# Patient Record
Sex: Female | Born: 1965 | Race: White | Hispanic: No | Marital: Single | Smoking: Never smoker
Health system: Southern US, Community
[De-identification: ages and names within clinical notes are randomized; demographics above are authoritative.]

## PROBLEM LIST (undated history)

## (undated) ENCOUNTER — Emergency Department (HOSPITAL_COMMUNITY): Payer: BLUE CROSS/BLUE SHIELD

## (undated) DIAGNOSIS — I8390 Asymptomatic varicose veins of unspecified lower extremity: Secondary | ICD-10-CM

## (undated) HISTORY — PX: TONSILLECTOMY: SHX5217

## (undated) HISTORY — DX: Asymptomatic varicose veins of unspecified lower extremity: I83.90

## (undated) HISTORY — PX: TONSILLECTOMY: SUR1361

---

## 1998-12-14 ENCOUNTER — Inpatient Hospital Stay (HOSPITAL_COMMUNITY): Admission: AD | Admit: 1998-12-14 | Discharge: 1998-12-16 | Payer: Self-pay | Admitting: Family Medicine

## 1998-12-21 ENCOUNTER — Encounter: Admission: RE | Admit: 1998-12-21 | Discharge: 1998-12-21 | Payer: Self-pay | Admitting: Family Medicine

## 1999-03-21 ENCOUNTER — Ambulatory Visit (HOSPITAL_BASED_OUTPATIENT_CLINIC_OR_DEPARTMENT_OTHER): Admission: RE | Admit: 1999-03-21 | Discharge: 1999-03-21 | Payer: Self-pay | Admitting: Otolaryngology

## 1999-04-10 ENCOUNTER — Other Ambulatory Visit: Admission: RE | Admit: 1999-04-10 | Discharge: 1999-04-10 | Payer: Self-pay | Admitting: Obstetrics & Gynecology

## 2000-04-04 ENCOUNTER — Other Ambulatory Visit: Admission: RE | Admit: 2000-04-04 | Discharge: 2000-04-04 | Payer: Self-pay | Admitting: Obstetrics & Gynecology

## 2001-05-01 ENCOUNTER — Other Ambulatory Visit: Admission: RE | Admit: 2001-05-01 | Discharge: 2001-05-01 | Payer: Self-pay | Admitting: Obstetrics & Gynecology

## 2004-10-25 ENCOUNTER — Other Ambulatory Visit: Admission: RE | Admit: 2004-10-25 | Discharge: 2004-10-25 | Payer: Self-pay | Admitting: Family Medicine

## 2004-10-31 ENCOUNTER — Encounter: Admission: RE | Admit: 2004-10-31 | Discharge: 2004-10-31 | Payer: Self-pay | Admitting: Family Medicine

## 2006-09-06 ENCOUNTER — Other Ambulatory Visit: Admission: RE | Admit: 2006-09-06 | Discharge: 2006-09-06 | Payer: Self-pay | Admitting: Family Medicine

## 2009-05-24 ENCOUNTER — Encounter: Admission: RE | Admit: 2009-05-24 | Discharge: 2009-05-24 | Payer: Self-pay | Admitting: Family Medicine

## 2012-06-04 ENCOUNTER — Other Ambulatory Visit: Payer: Self-pay | Admitting: Family Medicine

## 2012-06-04 DIAGNOSIS — Z1231 Encounter for screening mammogram for malignant neoplasm of breast: Secondary | ICD-10-CM

## 2012-06-19 ENCOUNTER — Ambulatory Visit: Payer: Self-pay

## 2012-06-20 ENCOUNTER — Ambulatory Visit: Payer: Self-pay

## 2012-07-07 ENCOUNTER — Ambulatory Visit: Payer: Self-pay

## 2012-07-18 ENCOUNTER — Ambulatory Visit
Admission: RE | Admit: 2012-07-18 | Discharge: 2012-07-18 | Disposition: A | Discharge status: Home / Self Care | Payer: 59 | Source: Ambulatory Visit | Attending: Family Medicine | Admitting: Family Medicine

## 2012-07-18 DIAGNOSIS — Z1231 Encounter for screening mammogram for malignant neoplasm of breast: Secondary | ICD-10-CM

## 2012-07-23 ENCOUNTER — Other Ambulatory Visit: Payer: Self-pay | Admitting: Family Medicine

## 2012-07-23 DIAGNOSIS — R928 Other abnormal and inconclusive findings on diagnostic imaging of breast: Secondary | ICD-10-CM

## 2012-07-31 ENCOUNTER — Ambulatory Visit
Admission: RE | Admit: 2012-07-31 | Discharge: 2012-07-31 | Disposition: A | Discharge status: Home / Self Care | Payer: 59 | Source: Ambulatory Visit | Attending: Family Medicine | Admitting: Family Medicine

## 2012-07-31 DIAGNOSIS — R928 Other abnormal and inconclusive findings on diagnostic imaging of breast: Secondary | ICD-10-CM

## 2013-02-21 ENCOUNTER — Ambulatory Visit (INDEPENDENT_AMBULATORY_CARE_PROVIDER_SITE_OTHER): Payer: 59 | Admitting: Family Medicine

## 2013-02-21 VITALS — BP 146/89 | HR 98 | Temp 98.4°F | Resp 16 | Ht 65.0 in | Wt 261.0 lb

## 2013-02-21 DIAGNOSIS — IMO0002 Reserved for concepts with insufficient information to code with codable children: Secondary | ICD-10-CM

## 2013-02-21 DIAGNOSIS — M549 Dorsalgia, unspecified: Secondary | ICD-10-CM

## 2013-02-21 DIAGNOSIS — S39012A Strain of muscle, fascia and tendon of lower back, initial encounter: Secondary | ICD-10-CM

## 2013-02-21 MED ORDER — HYDROCODONE-ACETAMINOPHEN 5-325 MG PO TABS: 1.0000 | ORAL_TABLET | Freq: Three times a day (TID) | ORAL | Status: DC | PRN

## 2013-02-21 NOTE — Progress Notes (Signed)
[facility] [address] [place] [address] [phone]  Date:  02/21/2013   Name:  Pam Mclaughlin   DOB:  1966-03-13   MRN:  [id number]  PCP:  Ailene Ravel, MD    Chief Complaint: Back Pain   History of Present Illness:  Pam Mclaughlin is a 47 y.o. very pleasant female patient who presents with the following:  She is here today with a back spasm- it started yesterday afternoon.  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   She has tried some ibuprofen- she took 800 mg total.  Did not help at all.   She had some generic flexeril which she did try but it was expired.  She did not think it helped all that much in the past anyway.    She is perimenopausal LMP 5 or 6 months ago. She is SA with her husband only so there is no chance of pregnancy- he has had a vasectomy 16 years ago which was successful.   The pain does radiate down her right leg when she sits, but there is no numbness, weakness, or bowel/ bladder incontinence.   Sh has had some back strains in the past but nothing serious.    There are no active problems to display for this patient.   History reviewed. No pertinent past medical history.  Past Surgical History  Procedure Laterality Date  . Cesarean section      History  Substance Use Topics  . Smoking status: Never Smoker   . Smokeless tobacco: Not on file  . Alcohol Use: 12.6 oz/week    21 Cans of beer per week    History reviewed. No pertinent family history.  No Known Allergies  Medication list has been reviewed and updated.  No current outpatient prescriptions on file prior to visit.   No current facility-administered medications on file prior to visit.    Review of Systems:  As per HPI- otherwise negative.   Physical Examination: Filed Vitals:   02/21/13 1043  BP: 146/89  Pulse: 98  Temp: 98.4 F (36.9 C)  Resp: 16   Filed Vitals:   02/21/13 1043  Height: 5\' 5"  (1.651 m)  Weight: 261 lb (118.389 kg)   Body mass index is 43.43 kg/(m^2). Ideal Body Weight: Weight in (lb) to have BMI = 25: 149.9  GEN: WDWN, NAD, Non-toxic, A & O x 3, obese HEENT: Atraumatic, Normocephalic. Neck supple. No masses, No LAD. Ears and Nose: No external deformity. CV: RRR, No M/G/R. No JVD. No thrill. No extra heart sounds. PULM: CTA B, no wheezes, crackles, rhonchi. No retractions. No resp. distress. No accessory muscle use. EXTR: No c/c/e NEURO Normal gait.  PSYCH: Normally interactive. Conversant. Not depressed or anxious appearing.  Calm demeanor.  Back: she indicates tenderness in the right lower back muscles.  No bony tenderness.  Flexion is decreased due to pain, but normal extension and rotation.   She has some pull in her back with SLR on the right.  Normal strength, sensation and DTR both legs.     Assessment and Plan: Back pain - Plan: HYDROcodone-acetaminophen (NORCO/VICODIN) 5-325 MG per tablet  Back strain, initial encounter  Back strain- so far flexeril has not been helpful.  She is also taking ibuprofen without relief.  Will give a few vicodin to use as needed for back pain.  Recommended heat and /or ice as needed, gentle activity.  She declined x-rays today.  She will let me know if not better in the next few days.  Cautioned her regarding sedation with the hydrocodone/ do not use with alcohol   Signed Abbe Amsterdam, MD

## 2013-02-21 NOTE — Patient Instructions (Addendum)
You may continue to use ibuprofen or aleve as needed.  Use the hydrocodone for pain that does not respond to OTC medications- remember it can make you sleepy.  Try heat on the painful area.  Let me know if you are not better in the next few days- Sooner if worse.   Avoid alcohol with the hydrocodone pills.

## 2013-02-24 ENCOUNTER — Telehealth: Payer: Self-pay

## 2013-02-24 NOTE — Telephone Encounter (Signed)
Pt states that she is continuing to have  muscle spasms, pt would like to see if she could possibly get flexeril.  Best# [phone]   [facility] [address]

## 2013-02-24 NOTE — Telephone Encounter (Signed)
Patient calling again about getting muscle relaxer. States the medicine relaxes her more than her muscles. [phone]

## 2013-02-25 MED ORDER — CYCLOBENZAPRINE HCL 5 MG PO TABS: 5.0000 mg | ORAL_TABLET | Freq: Three times a day (TID) | ORAL | Status: DC | PRN

## 2013-02-25 NOTE — Telephone Encounter (Signed)
Done

## 2013-03-24 ENCOUNTER — Ambulatory Visit: Payer: 59

## 2013-03-24 ENCOUNTER — Ambulatory Visit (INDEPENDENT_AMBULATORY_CARE_PROVIDER_SITE_OTHER): Payer: 59 | Admitting: Family Medicine

## 2013-03-24 VITALS — BP 142/82 | HR 81 | Temp 98.2°F | Resp 18 | Ht 63.5 in | Wt 260.0 lb

## 2013-03-24 DIAGNOSIS — M25561 Pain in right knee: Secondary | ICD-10-CM

## 2013-03-24 DIAGNOSIS — E669 Obesity, unspecified: Secondary | ICD-10-CM

## 2013-03-24 DIAGNOSIS — M25569 Pain in unspecified knee: Secondary | ICD-10-CM

## 2013-03-24 MED ORDER — METHYLPREDNISOLONE ACETATE 80 MG/ML IJ SUSP
80.0000 mg | Freq: Once | INTRAMUSCULAR | Status: AC
  Administered 2013-03-24: 80 mg via INTRA_ARTICULAR

## 2013-03-24 NOTE — Progress Notes (Signed)
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 She has no history of trauma, no history of arthritis or other joint problems.  Objective: No acute distress Patient is overweight. Inspection of leg reveals multiple dilated veins including their cost disease and spider veins. Palpation of the medial joint line of the right knee reveals tenderness but no effusion. Range of motion: Normal with no crepitus [facility] reading (PRIMARY) by  Dr. Milus Glazier:  Right knee:  Negative  After explaining the pros and cons of knee injection, patient agreed to have the cortisone injection rather than taking pills by mouth. The medial right knee was prepped with Betadine and then anesthetized with ethyl chloride. 1 cc of Marcaine and 1 cc of Depo-Medrol 80 mg per mL were then instilled into the knee without complication. Band-Aid was applied and patient tolerated the procedure well.  Assessment: Inflammatory knee reaction to chronic obesity and job requirements  Plan: Patient to let me know if this does not solve the problem is for 6 months. I've talked to her at length about weight loss and diet.  Pain, knee, right - Plan: DG Knee 1-2 Views Right, methylPREDNISolone acetate (DEPO-MEDROL) injection 80 mg  Elvina Sidle, MD

## 2013-03-30 ENCOUNTER — Telehealth: Payer: Self-pay

## 2013-03-30 NOTE — Telephone Encounter (Signed)
Request given to xray 

## 2013-03-30 NOTE — Telephone Encounter (Signed)
Patient would like to pick up the xrays done of her knee to take to an appt tomorrow please call [phone] when ready

## 2014-10-27 ENCOUNTER — Other Ambulatory Visit: Payer: Self-pay | Admitting: Family Medicine

## 2014-10-27 DIAGNOSIS — R921 Mammographic calcification found on diagnostic imaging of breast: Secondary | ICD-10-CM

## 2014-10-28 ENCOUNTER — Encounter: Payer: Self-pay | Admitting: Vascular Surgery

## 2014-10-28 ENCOUNTER — Other Ambulatory Visit: Payer: Self-pay

## 2014-10-28 DIAGNOSIS — I83813 Varicose veins of bilateral lower extremities with pain: Secondary | ICD-10-CM

## 2014-11-03 ENCOUNTER — Other Ambulatory Visit: Payer: Self-pay | Admitting: Family Medicine

## 2014-11-03 ENCOUNTER — Ambulatory Visit
Admission: RE | Admit: 2014-11-03 | Discharge: 2014-11-03 | Disposition: A | Discharge status: Home / Self Care | Payer: 59 | Source: Ambulatory Visit | Attending: Family Medicine | Admitting: Family Medicine

## 2014-11-03 ENCOUNTER — Other Ambulatory Visit: Payer: Self-pay

## 2014-11-03 DIAGNOSIS — R921 Mammographic calcification found on diagnostic imaging of breast: Secondary | ICD-10-CM

## 2014-11-12 ENCOUNTER — Other Ambulatory Visit: Payer: Self-pay | Admitting: Family Medicine

## 2014-11-12 DIAGNOSIS — R921 Mammographic calcification found on diagnostic imaging of breast: Secondary | ICD-10-CM

## 2014-11-16 ENCOUNTER — Ambulatory Visit
Admission: RE | Admit: 2014-11-16 | Discharge: 2014-11-16 | Disposition: A | Discharge status: Home / Self Care | Payer: 59 | Source: Ambulatory Visit | Attending: Family Medicine | Admitting: Family Medicine

## 2014-11-16 DIAGNOSIS — R921 Mammographic calcification found on diagnostic imaging of breast: Secondary | ICD-10-CM

## 2014-12-08 ENCOUNTER — Encounter: Payer: Self-pay | Admitting: Vascular Surgery

## 2014-12-09 ENCOUNTER — Ambulatory Visit (INDEPENDENT_AMBULATORY_CARE_PROVIDER_SITE_OTHER): Payer: 59 | Admitting: Vascular Surgery

## 2014-12-09 ENCOUNTER — Encounter: Payer: Self-pay | Admitting: Vascular Surgery

## 2014-12-09 ENCOUNTER — Ambulatory Visit (HOSPITAL_COMMUNITY)
Admission: RE | Admit: 2014-12-09 | Discharge: 2014-12-09 | Disposition: A | Discharge status: Home / Self Care | Payer: 59 | Source: Ambulatory Visit | Attending: Vascular Surgery | Admitting: Vascular Surgery

## 2014-12-09 VITALS — BP 130/79 | HR 92 | Resp 16 | Ht 63.0 in | Wt 228.0 lb

## 2014-12-09 DIAGNOSIS — I83893 Varicose veins of bilateral lower extremities with other complications: Secondary | ICD-10-CM | POA: Insufficient documentation

## 2014-12-09 DIAGNOSIS — I83813 Varicose veins of bilateral lower extremities with pain: Secondary | ICD-10-CM

## 2014-12-09 NOTE — Progress Notes (Signed)
[facility] [place] HISTORY AND PHYSICAL   History of Present Illness:  Patient is a 49 y.o. year old female who presents for evaluation of symptomatic varicose veins.  The patient complains of heaviness and achiness and fullness in both lower extremities. This occurs after standing on her feet all day at work. Her symptoms improved somewhat with elevation and rest overnight. She has worn thigh high compression stockings 20-30 mmHg as well as knee-high compression therapy stockings that she got from elastic therapy. She has been wearing these for a year. She denies prior history of DVT. Her family history is unknown due to the fact that she is adopted. She has one varicosity across her right inner thigh which develops numbness tingling and irritation sometimes. She also has difficulty at work. 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 She denies any history of diabetes. She has lost 50 pounds in the last year trying to improve her symptoms.  Past Medical History  Diagnosis Date  . Varicose veins     Past Surgical History  Procedure Laterality Date  . Cesarean section    . Tonsillectomy      Social History History  Substance Use Topics  . Smoking status: Never Smoker   . Smokeless tobacco: Never Used  . Alcohol Use: No    Family History Family History  Problem Relation Age of Onset  . Adopted: Yes    Allergies  Allergies  Allergen Reactions  . Bee Venom      Current Outpatient Prescriptions  Medication Sig Dispense Refill  . ibuprofen (ADVIL,MOTRIN) 800 MG tablet Take 800 mg by mouth every 8 (eight) hours as needed for pain.    . cyclobenzaprine (FLEXERIL) 5 MG tablet Take 1 tablet (5 mg total) by mouth 3 (three) times daily as needed for muscle spasms. (Patient not taking: Reported on 12/09/2014) 30 tablet 1  . HYDROcodone-acetaminophen (NORCO/VICODIN) 5-325 MG per tablet Take 1  tablet by mouth every 8 (eight) hours as needed for pain. (Patient not taking: Reported on 12/09/2014) 20 tablet 0   No current facility-administered medications for this visit.    ROS:   General:  + weight loss, Fever, chills  HEENT: No recent headaches, no nasal bleeding, no visual changes, no sore throat  Neurologic: No dizziness, blackouts, seizures. No recent symptoms of stroke or mini- stroke. No recent episodes of slurred speech, or temporary blindness.  Cardiac: No recent episodes of chest pain/pressure, no shortness of breath at rest.  + shortness of breath with exertion.  Denies history of atrial fibrillation or irregular heartbeat  Vascular: No history of rest pain in feet.  No history of claudication.  No history of non-healing ulcer, No history of DVT   Pulmonary: No home oxygen, no productive cough, no hemoptysis,  No asthma or wheezing  Musculoskeletal:   Arthritis,  Low back pain,   Joint pain  Hematologic:No history of hypercoagulable state.  No history of easy bleeding.  No history of anemia  Gastrointestinal: No hematochezia or melena,  No gastroesophageal reflux, no trouble swallowing  Urinary:  chronic Kidney disease,  on HD -  MWF or  TTHS,  Burning with urination,  Frequent urination,  Difficulty urinating;   Skin: No rashes  Psychological: No history of anxiety,  No history of depression   Physical Examination  Filed Vitals:   12/09/14 1451  BP: 130/79  Pulse: 92  Resp: 16  Height: 5\' 3"  (1.6 m)  Weight: 228 lb (103.42 kg)    Body mass index is 40.4 kg/(m^2).  General:  Alert and oriented, no acute distress HEENT: Normal Neck: No bruit or JVD Pulmonary: Clear to auscultation bilaterally Cardiac: Regular Rate and Rhythm without murmur Abdomen: Soft, non-tender, non-distended, no mass, obese Skin: No rash, multiple reticular and spider-type varicosities diffusely down the front of the right leg and posteriorly on  both legs 4 mm clusters of varicosities around the right anterior knee Extremity Pulses:  2+ radial, brachial, femoral, dorsalis pedis, posterior tibial pulses bilaterally Musculoskeletal: No deformity or edema  Neurologic: Upper and lower extremity motor 5/5 and symmetric  DATA:  Patient had bilateral venous reflux exam today. Showed bilateral greater saphenous reflux with right vein diameter 3-6 mm, left leg 3-8 mm diameter. She also had deep vein reflux in the common and superficial femoral vein.   ASSESSMENT:  Bilateral symptomatically varicose veins. The patient has been compliant with wearing compression in the last year. She will continue to wear her compression garments thigh-high length as much as possible.  She will continue her current regimen of trying to continue to lose weight as well as elevating her legs at the end of the day to improve symptoms.   PLAN:  She will follow-up in 3 months time for consideration of laser ablation of her greater saphenous vein. She would prefer to have the right leg intervention performed first as this is her most symptomatic side.  Fabienne Brunsharles Breezie Micucci, MD [facility] [place] Office: [phone] Pager: [phone]

## 2015-03-11 ENCOUNTER — Encounter: Payer: Self-pay | Admitting: Vascular Surgery

## 2015-03-15 ENCOUNTER — Ambulatory Visit (INDEPENDENT_AMBULATORY_CARE_PROVIDER_SITE_OTHER): Payer: 59 | Admitting: Vascular Surgery

## 2015-03-15 ENCOUNTER — Ambulatory Visit: Payer: 59 | Admitting: Vascular Surgery

## 2015-03-15 ENCOUNTER — Encounter: Payer: Self-pay | Admitting: Vascular Surgery

## 2015-03-15 VITALS — BP 118/80 | HR 74 | Resp 16 | Ht 63.75 in | Wt 220.0 lb

## 2015-03-15 DIAGNOSIS — I83893 Varicose veins of bilateral lower extremities with other complications: Secondary | ICD-10-CM | POA: Diagnosis not present

## 2015-03-15 NOTE — Progress Notes (Signed)
Subjective:     Patient ID: Pam Mclaughlin, female   DOB: 25-May-1966, 49 y.o.   MRN: [id number]  HPI this 49 year old female returns for continued follow-up regarding her painful varicosities in both leg right worse and left. Patient has extensive large bulging varicosities in the right anterior thigh as well as some intradermal reticular veins which are quite thin and she has aching and throbbing discomfort despite wearing long leg elastic compression stockings 20-30 mm gradient trying elevation ibuprofen. She works on her feet most of the day and her symptoms worsen as the day progresses. She develops edema in both ankles. She has no history of DVT thrombophlebitis or bleeding or stasis ulcers.  Past Medical History  Diagnosis Date  . Varicose veins     History  Substance Use Topics  . Smoking status: Never Smoker   . Smokeless tobacco: Never Used  . Alcohol Use: No    Family History  Problem Relation Age of Onset  . Adopted: Yes    Allergies  Allergen Reactions  . Bee Venom      Current outpatient prescriptions:  .  cyclobenzaprine (FLEXERIL) 5 MG tablet, Take 1 tablet (5 mg total) by mouth 3 (three) times daily as needed for muscle spasms. (Patient not taking: Reported on 12/09/2014), Disp: 30 tablet, Rfl: 1 .  HYDROcodone-acetaminophen (NORCO/VICODIN) 5-325 MG per tablet, Take 1 tablet by mouth every 8 (eight) hours as needed for pain. (Patient not taking: Reported on 12/09/2014), Disp: 20 tablet, Rfl: 0 .  ibuprofen (ADVIL,MOTRIN) 800 MG tablet, Take 800 mg by mouth every 8 (eight) hours as needed for pain., Disp: , Rfl:   Filed Vitals:   03/15/15 1519  BP: 118/80  Pulse: 74  Resp: 16  Height: 5' 3.75" (1.619 m)  Weight: 220 lb (99.791 kg)    Body mass index is 38.07 kg/(m^2).           Review of Systems denies chest pain, dyspnea on exertion, PND, orthopnea, hemoptysis.     Objective:   Physical Exam BP 118/80 mmHg  Pulse 74  Resp 16  Ht 5' 3.75" (1.619  m)  Wt 220 lb (99.791 kg)  BMI 38.07 kg/m2  Dental obese female in no apparent distress alert and oriented 3 Lungs no rhonchi or wheezing Right lower extremity with extensive large bulging varicosities beginning in the mid anterior thigh extending lateral to the knee into the lateral calf area. Large patch of superficial reticular veins in the dermis medially in the thigh which are quite thin. 1+ edema distally with 3+ dorsalis pedis pulse palpable. Left leg with bulging varicosities lateral thigh into the lateral popliteal area where there is extensive network of reticular and spider veins. 3+ dorsalis pedis pulse palpable.   I have reviewed her venous reflux study from March 2016 and also performed a independent bedside sono site ultrasound exam. She does have great saphenous reflux bilaterally which is clearly supplying these painful varicosities.    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 Symptoms are affecting patient's daily living and ability to work and a resistant to conservative measures    Plan:     Patient needs number-one laser ablation right great saphenous vein plus greater than 20 stab phlebectomy and 2 courses of sclerotherapy followed by #2 laser ablation left great saphenous vein +10-20 stab phlebectomy and 2 courses of sclerotherapy We will proceed with precertification to perform this to relieve her symptoms which are affecting  her daily living and ability to work. She stands as a Civil engineer, contracting during the day

## 2015-03-23 ENCOUNTER — Encounter: Payer: Self-pay | Admitting: Vascular Surgery

## 2015-03-28 ENCOUNTER — Encounter: Payer: Self-pay | Admitting: Vascular Surgery

## 2015-03-28 ENCOUNTER — Ambulatory Visit (INDEPENDENT_AMBULATORY_CARE_PROVIDER_SITE_OTHER): Payer: 59 | Admitting: Vascular Surgery

## 2015-03-28 VITALS — BP 137/85 | HR 75 | Resp 16 | Ht 63.0 in | Wt 214.0 lb

## 2015-03-28 DIAGNOSIS — I83893 Varicose veins of bilateral lower extremities with other complications: Secondary | ICD-10-CM | POA: Diagnosis not present

## 2015-03-28 NOTE — Progress Notes (Signed)
Subjective:     Patient ID: Pam Mclaughlin, female   DOB: Jan 18, 1966, 49 y.o.   MRN: [id number]  HPI this 49 year old female had multiple stab phlebectomy-greater than 20-the right leg of painful varicosities performed under local tumescent anesthesia. She tolerated the procedure well.   Review of Systems     Objective:   Physical Exam BP 137/85 mmHg  Pulse 75  Resp 16  Ht 5\' 3"  (1.6 m)  Wt 214 lb (97.07 kg)  BMI 37.92 kg/m2       Assessment:     Well-tolerated multiple stab phlebectomy painful varicosities performed under local tumescent anesthesia    Plan:     Return on July 11 for similar procedure and contralateral left leg

## 2015-03-28 NOTE — Progress Notes (Signed)
.     Stab Phlebectomy Procedure  Pam LowensteinKim E Allmendinger DOB:03/10/1966  03/28/2015  Consent signed: Yes  Surgeon:J.D. Hart RochesterLawson  Procedure: stab phlebectomy: right leg  BP 137/85 mmHg  Pulse 75  Resp 16  Ht 5\' 3"  (1.6 m)  Wt 214 lb (97.07 kg)  BMI 37.92 kg/m2  Start time: 1pm   End time: 1:50pm   Tumescent Anesthesia: 400 cc 0.9% NaCl with 50 cc Lidocaine HCL with 1% Epi and 15 cc 8.4% NaHCO3  Local Anesthesia: 7 cc Lidocaine HCL and NaHCO3 (ratio 2:1)    Stab Phlebectomy: >20 Sites: Thigh and Calf  Patient tolerated procedure well: Yes  Notes:   Description of Procedure:  After marking the course of the secondary varicosities, the patient was placed on the operating table in the supine position, and the right leg was prepped and draped in sterile fashion.    The patient was then put into Trendelenburg position.  Local anesthetic was administered at the previously marked varicosities, and tumescent anesthesia was administered around the vessels.  Greater than 20 stab wounds were made using the tip of an 11 blade. And using the vein hook, the phlebectomies were performed using a hemostat to avulse the varicosities.  Adequate hemostasis was achieved, and steri strips were applied to the stab wound.      ABD pads and thigh high compression stockings were applied as well ace wraps where needed. Blood loss was less than 15 cc.  The patient ambulated out of the operating room having tolerated the procedure well.

## 2015-03-29 ENCOUNTER — Telehealth: Payer: Self-pay | Admitting: *Deleted

## 2015-03-29 NOTE — Telephone Encounter (Signed)
Pt doing well. Some discomfort. No bleeding from the stab sites so I told her she could loosen the ace wraps. She is following all the instructions.

## 2015-04-07 ENCOUNTER — Encounter: Payer: Self-pay | Admitting: Vascular Surgery

## 2015-04-07 ENCOUNTER — Other Ambulatory Visit: Payer: Self-pay | Admitting: *Deleted

## 2015-04-07 DIAGNOSIS — F411 Generalized anxiety disorder: Secondary | ICD-10-CM

## 2015-04-07 MED ORDER — LORAZEPAM 1 MG PO TABS: 1.0000 mg | ORAL_TABLET | Freq: Once | ORAL | Status: DC

## 2015-04-11 ENCOUNTER — Encounter: Payer: Self-pay | Admitting: Vascular Surgery

## 2015-04-11 ENCOUNTER — Ambulatory Visit (INDEPENDENT_AMBULATORY_CARE_PROVIDER_SITE_OTHER): Payer: Commercial Managed Care - HMO | Admitting: Vascular Surgery

## 2015-04-11 VITALS — BP 148/83 | HR 69 | Resp 16 | Ht 63.0 in | Wt 215.0 lb

## 2015-04-11 DIAGNOSIS — I83892 Varicose veins of left lower extremities with other complications: Secondary | ICD-10-CM | POA: Insufficient documentation

## 2015-04-11 NOTE — Progress Notes (Signed)
    Stab Phlebectomy Procedure  Pam LowensteinKim E Mclaughlin DOB:09-29-1966  04/11/2015  Consent signed: Yes  Surgeon:J.D. Hart RochesterLawson  Procedure: stab phlebectomy: left leg  BP 148/83 mmHg  Pulse 69  Resp 16  Ht 5\' 3"  (1.6 m)  Wt 215 lb (97.523 kg)  BMI 38.09 kg/m2  Start time: 1pm   End time: 1:35pm   Tumescent Anesthesia: 100 cc 0.9% NaCl with 50 cc Lidocaine HCL with 1% Epi and 15 cc 8.4% NaHCO3  Local Anesthesia: 5 cc Lidocaine HCL and NaHCO3 (ratio 2:1)    Stab Phlebectomy: 10-20 Sites: Thigh  Patient tolerated procedure well: Yes  Notes:   Description of Procedure:  After marking the course of the secondary varicosities, the patient was placed on the operating table in the supine position, and the left leg was prepped and draped in sterile fashion.    The patient was then put into Trendelenburg position.  Local anesthetic was administered at the previously marked varicosities, and tumescent anesthesia was administered around the vessels.  Ten to 20 stab wounds were made using the tip of an 11 blade. And using the vein hook, the phlebectomies were performed using a hemostat to avulse the varicosities.  Adequate hemostasis was achieved, and steri strips were applied to the stab wound.      ABD pads and thigh high compression stockings were applied as well ace wraps where needed. Blood loss was less than 15 cc.  The patient ambulated out of the operating room having tolerated the procedure well.

## 2015-04-11 NOTE — Progress Notes (Signed)
Subjective:     Patient ID: Pam Mclaughlin, female   DOB: 1965/10/04, 49 y.o.   MRN: [id number]  HPI this 49 year old female had multiple stab phlebectomy (10-20) of painful varicosities left leg formed under local tumescent anesthesia. She tolerated the procedure well.   Review of Systems     Objective:   Physical Exam BP 148/83 mmHg  Pulse 69  Resp 16  Ht 5\' 3"  (1.6 m)  Wt 215 lb (97.523 kg)  BMI 38.09 kg/m2  .     Assessment:     Well-tolerated stab phlebectomy left leg performed under local tumescent anesthesia) 10-20)    Plan:     Patient will return in the near future for sclerotherapy of residual varicosities left leg

## 2015-04-12 ENCOUNTER — Telehealth: Payer: Self-pay | Admitting: *Deleted

## 2015-04-12 NOTE — Telephone Encounter (Signed)
Pt doing well post procedure. Slept well. Has taken some Tylenol but over all doing better this time compared to last time. Scheduled her next sclero appts.

## 2015-05-31 ENCOUNTER — Encounter: Payer: Self-pay | Admitting: *Deleted

## 2015-06-01 ENCOUNTER — Ambulatory Visit: Payer: 59 | Admitting: *Deleted

## 2015-06-01 ENCOUNTER — Ambulatory Visit (INDEPENDENT_AMBULATORY_CARE_PROVIDER_SITE_OTHER): Payer: Commercial Managed Care - HMO | Admitting: *Deleted

## 2015-06-01 ENCOUNTER — Encounter: Payer: Self-pay | Admitting: Vascular Surgery

## 2015-06-01 DIAGNOSIS — I83893 Varicose veins of bilateral lower extremities with other complications: Secondary | ICD-10-CM | POA: Diagnosis not present

## 2015-06-01 NOTE — Progress Notes (Signed)
X=.3% Sotradecol administered with a 27g butterfly.  Patient received a total of 24cc.  Treated the majority of her large spider veins. Easy access and tol well. She has one more ins covered tx. Will see her in 4 weeks.  Photos: No.  Compression stockings applied: Yes.

## 2015-06-20 ENCOUNTER — Encounter: Payer: Self-pay | Admitting: *Deleted

## 2015-06-22 ENCOUNTER — Ambulatory Visit (INDEPENDENT_AMBULATORY_CARE_PROVIDER_SITE_OTHER): Payer: 59 | Admitting: *Deleted

## 2015-06-22 DIAGNOSIS — I83893 Varicose veins of bilateral lower extremities with other complications: Secondary | ICD-10-CM | POA: Diagnosis not present

## 2015-06-22 NOTE — Progress Notes (Signed)
X=.3% Sotradecol administered with a 27g butterfly.  Patient received a total of 24cc.  Treated all remaining areas of concern. Tol well. Easy access. She has so many vv's. Hope she is happy with the result. Areas treated before on 8/31 are resolving as expected. Follow prn.  Photos: No.  Compression stockings applied: Yes.

## 2016-09-11 ENCOUNTER — Ambulatory Visit (INDEPENDENT_AMBULATORY_CARE_PROVIDER_SITE_OTHER): Payer: Self-pay | Admitting: Family Medicine

## 2016-09-11 VITALS — BP 132/84 | HR 89 | Temp 99.8°F | Resp 16 | Ht 63.0 in | Wt 217.0 lb

## 2016-09-11 DIAGNOSIS — R05 Cough: Secondary | ICD-10-CM

## 2016-09-11 DIAGNOSIS — R059 Cough, unspecified: Secondary | ICD-10-CM

## 2016-09-11 DIAGNOSIS — R509 Fever, unspecified: Secondary | ICD-10-CM

## 2016-09-11 DIAGNOSIS — R112 Nausea with vomiting, unspecified: Secondary | ICD-10-CM

## 2016-09-11 DIAGNOSIS — E860 Dehydration: Secondary | ICD-10-CM

## 2016-09-11 LAB — POCT URINALYSIS DIP (MANUAL ENTRY)
Glucose, UA: NEGATIVE
Leukocytes, UA: NEGATIVE
Nitrite, UA: NEGATIVE
PH UA: 5.5
UROBILINOGEN UA: 1

## 2016-09-11 LAB — POC MICROSCOPIC URINALYSIS (UMFC): MUCUS RE: ABSENT

## 2016-09-11 LAB — POCT INFLUENZA A/B
INFLUENZA A, POC: NEGATIVE
Influenza B, POC: NEGATIVE

## 2016-09-11 MED ORDER — SODIUM CHLORIDE 0.9 % IV BOLUS (SEPSIS)
1000.0000 mL | Freq: Once | INTRAVENOUS | Status: AC
  Administered 2016-09-11: 1000 mL via INTRAVENOUS

## 2016-09-11 NOTE — Patient Instructions (Addendum)
IF you received an x-ray today, you will receive an invoice from [facility]. Please contact [facility] at [phone] with questions or concerns regarding your invoice.   IF you received labwork today, you will receive an invoice from [facility]. Please contact [facility] at [phone] with questions or concerns regarding your invoice.   Our billing staff will not be able to assist you with questions regarding bills from these companies.  You will be contacted with the lab results as soon as they are available. The fastest way to get your results is to activate your My Chart account. Instructions are located on the last page of this paperwork. If you have not heard from us regarding the results in 2 weeks, please contact this office.       Dehydration, Adult Dehydration is a condition in which there is not enough fluid or water in the body. This happens when you lose more fluids than you take in. Important organs, such as the kidneys, brain, and heart, cannot function without a proper amount of fluids. Any loss of fluids from the body can lead to dehydration. Dehydration can range from mild to severe. This condition should be treated right away to prevent it from becoming severe. What are the causes? This condition may be caused by:  Vomiting.  Diarrhea.  Excessive sweating, such as from heat exposure or exercise.  Not drinking enough fluid, especially:  When ill.  While doing activity that requires a lot of energy.  Excessive urination.  Fever.  Infection.  Certain medicines, such as medicines that cause the body to lose excess fluid (diuretics).  Inability to access safe drinking water.  Reduced physical ability to get adequate water and food. What increases the risk? This condition is more likely to develop in people:  Who have a poorly controlled long-term (chronic) illness, such as diabetes, heart disease, or  kidney disease.  Who are age 50 or older.  Who are disabled.  Who live in a place with high altitude.  Who play endurance sports. What are the signs or symptoms? Symptoms of mild dehydration may include:  Thirst.  Dry lips.  Slightly dry mouth.  Dry, warm skin.  Dizziness. Symptoms of moderate dehydration may include:  Very dry mouth.  Muscle cramps.  Dark urine. Urine may be the color of tea.  Decreased urine production.  Decreased tear production.  Heartbeat that is irregular or faster than normal (palpitations).  Headache.  Light-headedness, especially when you stand up from a sitting position.  Fainting (syncope). Symptoms of severe dehydration may include:  Changes in skin, such as:  Cold and clammy skin.  Blotchy (mottled) or pale skin.  Skin that does not quickly return to normal after being lightly pinched and released (poor skin turgor).  Changes in body fluids, such as:  Extreme thirst.  No tear production.  Inability to sweat when body temperature is high, such as in hot weather.  Very little urine production.  Changes in vital signs, such as:  Weak pulse.  Pulse that is more than 100 beats a minute when sitting still.  Rapid breathing.  Low blood pressure.  Other changes, such as:  Sunken eyes.  Cold hands and feet.  Confusion.  Lack of energy (lethargy).  Difficulty waking up from sleep.  Short-term weight loss.  Unconsciousness. How is this diagnosed? This condition is diagnosed based on your symptoms and a physical exam. Blood and urine tests may be done to help  confirm the diagnosis. How is this treated? Treatment for this condition depends on the severity. Mild or moderate dehydration can often be treated at home. Treatment should be started right away. Do not wait until dehydration becomes severe. Severe dehydration is an emergency and it needs to be treated in a hospital. Treatment for mild dehydration may  include:  Drinking more fluids.  Replacing salts and minerals in your blood (electrolytes) that you may have lost. Treatment for moderate dehydration may include:  Drinking an oral rehydration solution (ORS). This is a drink that helps you replace fluids and electrolytes (rehydrate). It can be found at pharmacies and retail stores. Treatment for severe dehydration may include:  Receiving fluids through an IV tube.  Receiving an electrolyte solution through a feeding tube that is passed through your nose and into your stomach (nasogastric tube, or NG tube).  Correcting any abnormalities in electrolytes.  Treating the underlying cause of dehydration. Follow these instructions at home:  If directed by your health care provider, drink an ORS:  Make an ORS by following instructions on the package.  Start by drinking small amounts, about  cup (120 mL) every 5-10 minutes.  Slowly increase how much you drink until you have taken the amount recommended by your health care provider.  Drink enough clear fluid to keep your urine clear or pale yellow. If you were told to drink an ORS, finish the ORS first, then start slowly drinking other clear fluids. Drink fluids such as:  Water. Do not drink only water. Doing that can lead to having too little salt (sodium) in the body (hyponatremia).  Ice chips.  Fruit juice that you have added water to (diluted fruit juice).  Low-calorie sports drinks.  Avoid:  Alcohol.  Drinks that contain a lot of sugar. These include high-calorie sports drinks, fruit juice that is not diluted, and soda.  Caffeine.  Foods that are greasy or contain a lot of fat or sugar.  Take over-the-counter and prescription medicines only as told by your health care provider.  Do not take sodium tablets. This can lead to having too much sodium in the body (hypernatremia).  Eat foods that contain a healthy balance of electrolytes, such as bananas, oranges, potatoes,  tomatoes, and spinach.  Keep all follow-up visits as told by your health care provider. This is important. Contact a health care provider if:  You have abdominal pain that:  Gets worse.  Stays in one area (localizes).  You have a rash.  You have a stiff neck.  You are more irritable than usual.  You are sleepier or more difficult to wake up than usual.  You feel weak or dizzy.  You feel very thirsty.  You have urinated only a small amount of very dark urine over 6-8 hours. Get help right away if:  You have symptoms of severe dehydration.  You cannot drink fluids without vomiting.  Your symptoms get worse with treatment.  You have a fever.  You have a severe headache.  You have vomiting or diarrhea that:  Gets worse.  Does not go away.  You have blood or green matter (bile) in your vomit.  You have blood in your stool. This may cause stool to look black and tarry.  You have not urinated in 6-8 hours.  You faint.  Your heart rate while sitting still is over 100 beats a minute.  You have trouble breathing. This information is not intended to replace advice given to you by your  health care provider. Make sure you discuss any questions you have with your health care provider. Document Released: 09/17/2005 Document Revised: 04/13/2016 Document Reviewed: 11/11/2015 Elsevier Interactive Patient Education  2017 [employer]

## 2016-09-11 NOTE — Progress Notes (Signed)
Chief Complaint  Patient presents with  . Cough    and fever, per pt 102. Sore throat. Since Sunday     HPI  Pt has been having fevers, chills with temperature of 102 since Sunday.  She reports that she has been having ear pain, congestion in the head,  Her cough is productive of yellow sputum.  She reports that she tried to keep water down but has been throwing up so she has not been able to eat.  She reports that she occasionally feels shortness of breath. She reports that she feels dehydrated and dizzy. She states that she took ibuprofen yesterday afternoon. She states that when she vomits she feels worse. She reports that she has a lot of postnasal drip and feels like that is causing the coughing. Sick contacts- her daughter has the flu and her mother has vomiting and diarrhea She was unable to work yesterday.  She denies a history of asthma She is a nonsmoker       Past Medical History:  Diagnosis Date  . Varicose veins     Current Outpatient Prescriptions  Medication Sig Dispense Refill  . ibuprofen (ADVIL,MOTRIN) 800 MG tablet Take 800 mg by mouth every 8 (eight) hours as needed for pain.    . cyclobenzaprine (FLEXERIL) 5 MG tablet Take 1 tablet (5 mg total) by mouth 3 (three) times daily as needed for muscle spasms. (Patient not taking: Reported on 09/11/2016) 30 tablet 1  . HYDROcodone-acetaminophen (NORCO/VICODIN) 5-325 MG per tablet Take 1 tablet by mouth every 8 (eight) hours as needed for pain. (Patient not taking: Reported on 09/11/2016) 20 tablet 0  . LORazepam (ATIVAN) 1 MG tablet Take 1 tablet (1 mg total) by mouth once. Take 1 tablet 60 minutes prior to the procedure. (Patient not taking: Reported on 09/11/2016) 1 tablet 0   No current facility-administered medications for this visit.     Allergies:  Allergies  Allergen Reactions  . Bee Venom     Past Surgical History:  Procedure Laterality Date  . CESAREAN SECTION    . TONSILLECTOMY      Social  History   Social History  . Marital status: Married    Spouse name: N/A  . Number of children: N/A  . Years of education: N/A   Social History Main Topics  . Smoking status: Never Smoker  . Smokeless tobacco: Never Used  . Alcohol use No  . Drug use: No  . Sexual activity: No   Other Topics Concern  . None   Social History Narrative  . None    ROS  Objective: Vitals:   09/11/16 0855 09/11/16 1049  BP: 132/84   Pulse: (!) 106 89  Resp: 17 16  Temp: 99.8 F (37.7 C)   TempSrc: Oral   SpO2: 93% 98%  Weight: 217 lb (98.4 kg)   Height: 5\' 3"  (1.6 m)     Physical Exam  Constitutional: She is oriented to person, place, and time. She appears well-developed and well-nourished. She has a sickly appearance.  HENT:  Head: Normocephalic and atraumatic.  Right Ear: External ear normal.  Left Ear: External ear normal.  Nose: Nose normal.  Mouth/Throat: Mucous membranes are dry. No oral lesions. No posterior oropharyngeal edema. No tonsillar exudate.  Eyes: Conjunctivae and EOM are normal. Right eye exhibits no discharge. Left eye exhibits no discharge.  Cardiovascular: Normal rate, regular rhythm and normal heart sounds.   No murmur heard. Pulmonary/Chest: Effort normal and breath sounds normal.  No respiratory distress. She has no wheezes. She has no rales.  Abdominal: Soft. Bowel sounds are normal. She exhibits no distension and no mass. There is no tenderness. There is no guarding.  Musculoskeletal: Normal range of motion. She exhibits no edema.  Neurological: She is alert and oriented to person, place, and time.  Skin: Skin is warm.  Psychiatric: She has a normal mood and affect. Her behavior is normal. Judgment and thought content normal.    Assessment and Plan Selena BattenKim was seen today for cough.  Diagnoses and all orders for this visit:  Fever and chills- continue tylenol and ibuprofen -     POCT Influenza A/B -     POCT Microscopic Urinalysis ([facility]) -     POCT  urinalysis dipstick -     sodium chloride 0.9 % bolus 1,000 mL; Inject 1,000 mLs into the vein once.  Non-intractable vomiting with nausea, unspecified vomiting type-  Advised pt to maintain hydration until urine is clear -     POCT Influenza A/B -     POCT Microscopic Urinalysis ([facility]) -     POCT urinalysis dipstick -     sodium chloride 0.9 % bolus 1,000 mL; Inject 1,000 mLs into the vein once.  Cough- flu negative  Advised pt to continue cough suppression -     POCT Influenza A/B  Dehydration- tachycardia resolved after 1L NS IV bolus -     POCT Microscopic Urinalysis ([facility]) -     POCT urinalysis dipstick -     Insert peripheral IV -     sodium chloride 0.9 % bolus 1,000 mL; Inject 1,000 mLs into the vein once.  A total of 45 minutes were spent face-to-face with the patient during this encounter and over half of that time was spent on counseling and coordination of care.    Neeti Knudtson A Wade Sigala

## 2016-09-12 ENCOUNTER — Emergency Department (HOSPITAL_COMMUNITY): Payer: 59

## 2016-09-12 ENCOUNTER — Ambulatory Visit (INDEPENDENT_AMBULATORY_CARE_PROVIDER_SITE_OTHER): Payer: Self-pay | Admitting: Family Medicine

## 2016-09-12 ENCOUNTER — Encounter (HOSPITAL_COMMUNITY): Payer: Self-pay | Admitting: Emergency Medicine

## 2016-09-12 ENCOUNTER — Emergency Department (HOSPITAL_COMMUNITY)
Admission: EM | Admit: 2016-09-12 | Discharge: 2016-09-12 | Disposition: A | Discharge status: Home / Self Care | Payer: 59 | Attending: Emergency Medicine | Admitting: Emergency Medicine

## 2016-09-12 VITALS — BP 122/70 | HR 103 | Temp 98.7°F | Resp 18 | Ht 63.0 in | Wt 215.0 lb

## 2016-09-12 DIAGNOSIS — R112 Nausea with vomiting, unspecified: Secondary | ICD-10-CM

## 2016-09-12 DIAGNOSIS — R509 Fever, unspecified: Secondary | ICD-10-CM | POA: Diagnosis present

## 2016-09-12 DIAGNOSIS — B349 Viral infection, unspecified: Secondary | ICD-10-CM | POA: Diagnosis not present

## 2016-09-12 DIAGNOSIS — R1031 Right lower quadrant pain: Secondary | ICD-10-CM | POA: Insufficient documentation

## 2016-09-12 LAB — COMPREHENSIVE METABOLIC PANEL
ALT: 21 U/L (ref 14–54)
ANION GAP: 13 (ref 5–15)
AST: 23 U/L (ref 15–41)
Albumin: 3.6 g/dL (ref 3.5–5.0)
Alkaline Phosphatase: 70 U/L (ref 38–126)
BUN: 6 mg/dL (ref 6–20)
CALCIUM: 8.9 mg/dL (ref 8.9–10.3)
CHLORIDE: 105 mmol/L (ref 101–111)
CO2: 22 mmol/L (ref 22–32)
CREATININE: 0.61 mg/dL (ref 0.44–1.00)
Glucose, Bld: 104 mg/dL — ABNORMAL HIGH (ref 65–99)
Potassium: 3.5 mmol/L (ref 3.5–5.1)
Sodium: 140 mmol/L (ref 135–145)
Total Bilirubin: 0.6 mg/dL (ref 0.3–1.2)
Total Protein: 7.1 g/dL (ref 6.5–8.1)

## 2016-09-12 LAB — LIPASE, BLOOD: LIPASE: 16 U/L (ref 11–51)

## 2016-09-12 LAB — CBC
HCT: 39.5 % (ref 36.0–46.0)
Hemoglobin: 13.5 g/dL (ref 12.0–15.0)
MCH: 30.1 pg (ref 26.0–34.0)
MCHC: 34.2 g/dL (ref 30.0–36.0)
MCV: 88 fL (ref 78.0–100.0)
PLATELETS: 242 10*3/uL (ref 150–400)
RBC: 4.49 MIL/uL (ref 3.87–5.11)
RDW: 12.5 % (ref 11.5–15.5)
WBC: 17.3 10*3/uL — ABNORMAL HIGH (ref 4.0–10.5)

## 2016-09-12 LAB — I-STAT BETA HCG BLOOD, ED (MC, WL, AP ONLY): HCG, QUANTITATIVE: 7.3 m[IU]/mL — AB (ref <5)

## 2016-09-12 MED ORDER — SODIUM CHLORIDE 0.9 % IV BOLUS (SEPSIS)
1000.0000 mL | Freq: Once | INTRAVENOUS | Status: AC
  Administered 2016-09-12: 1000 mL via INTRAVENOUS

## 2016-09-12 MED ORDER — ONDANSETRON HCL 4 MG/2ML IJ SOLN
4.0000 mg | Freq: Once | INTRAMUSCULAR | Status: AC
  Administered 2016-09-12: 4 mg via INTRAVENOUS

## 2016-09-12 MED ORDER — PSEUDOEPHEDRINE-GUAIFENESIN ER 60-600 MG PO TB12: 1.0000 | ORAL_TABLET | Freq: Two times a day (BID) | ORAL | 0 refills | Status: DC

## 2016-09-12 MED ORDER — ONDANSETRON HCL 4 MG PO TABS: 4.0000 mg | ORAL_TABLET | Freq: Four times a day (QID) | ORAL | 0 refills | Status: DC

## 2016-09-12 MED ORDER — IOPAMIDOL (ISOVUE-300) INJECTION 61%
INTRAVENOUS | Status: AC
  Administered 2016-09-12: 100 mL
  Filled 2016-09-12: qty 100

## 2016-09-12 MED ORDER — MORPHINE SULFATE (PF) 4 MG/ML IV SOLN
4.0000 mg | Freq: Once | INTRAVENOUS | Status: AC
  Administered 2016-09-12: 4 mg via INTRAVENOUS
  Filled 2016-09-12: qty 1

## 2016-09-12 MED ORDER — SODIUM CHLORIDE 0.9 % IV SOLN
INTRAVENOUS | Status: DC
  Administered 2016-09-12: 13:00:00 via INTRAVENOUS

## 2016-09-12 MED ORDER — BENZONATATE 100 MG PO CAPS: 100.0000 mg | ORAL_CAPSULE | Freq: Three times a day (TID) | ORAL | 0 refills | Status: DC

## 2016-09-12 NOTE — ED Notes (Signed)
Pt returned from CT °

## 2016-09-12 NOTE — Progress Notes (Signed)
Pam LowensteinKim E Brisky is a 50 y.o. female who presents to [facility] today for nausea and vomiting:   Nausea and vomiting: Patient with cough and fevers over the weekend as well as posttussive emesis for the same period of time. This progressed until she presented to care yesterday. She was given a liter bolus. She had urinalysis which was negative. She had a negative flu swab yesterday. She was sent home with diagnosis of cough likely secondary to viral illness and supportive care.  Yesterday she was unable to tolerate much by mouth. She had a few crackers for dinner last night but nothing else to eat. She has not been able to eat or drink anything today. She denies any overt abdominal pain simply the nausea vomiting. She had one episode of vomiting earlier this morning which was green/bilious. Otherwise her vomiting has been yellow.    Her cough is productive of thick sputum. She's had no chest pain. No diarrhea. Her last bowel movement was on Saturday which was hard. She states she has normal bowel movements prior to that. She is passing gas well today.  ROS as above.    PMH reviewed. Patient is a nonsmoker.   Past Medical History:  Diagnosis Date  . Varicose veins    Past Surgical History:  Procedure Laterality Date  . CESAREAN SECTION    . TONSILLECTOMY      Medications reviewed. Current Outpatient Prescriptions  Medication Sig Dispense Refill  . LORazepam (ATIVAN) 1 MG tablet Take 1 tablet (1 mg total) by mouth once. Take 1 tablet 60 minutes prior to the procedure. 1 tablet 0  . cyclobenzaprine (FLEXERIL) 5 MG tablet Take 1 tablet (5 mg total) by mouth 3 (three) times daily as needed for muscle spasms. (Patient not taking: Reported on 09/12/2016) 30 tablet 1  . HYDROcodone-acetaminophen (NORCO/VICODIN) 5-325 MG per tablet Take 1 tablet by mouth every 8 (eight) hours as needed for pain. (Patient not taking: Reported on 09/12/2016) 20 tablet 0   No current  facility-administered medications for this visit.      Physical Exam:  BP 122/70 (BP Location: Right Arm, Patient Position: Sitting, Cuff Size: Large)   Pulse (!) 103   Temp 98.7 F (37.1 C) (Oral)   Resp 18   Ht 5\' 3"  (1.6 m)   Wt 215 lb (97.5 kg)   SpO2 96%   BMI 38.09 kg/m  Gen:  Alert, cooperative patient who appears in moderate distress.  Ill-appearing.  Lying on bed with cloth over her eyes. Head: [place]/AT.   Eyes:  EOMI, PERRL.   Ears:  External ears WNL, Bilateral TM's normal without retraction, redness or bulging. Nose:  Septum midline  Mouth:  Dry mucus membranes.  Cracked lips  Neck:  Supple.  Nontender. Pulm:  Clear to auscultation bilaterally with good air movement.  No wheezes or rales noted.   Cardiac:   Tachycardic with a regular rhythm.. Abd:  Soft/nondistended.  Does have some moderate right lower quadrant tenderness with guarding. No rebound. Skin: No rash Neuro: No focal deficits noted throughout. Exts: Non edematous BL  LE, warm and well perfused.  Psych:  Not depressed or anxious appearing.  Linear and coherent thought process as evidenced by speech pattern. Smiles spontaneously.    Assessment and Plan:  1.  Nausea/vomiting: - Patient given 4 mg Zofran here and 1 L bolus.  She remained tachycardic.   -However she still has ongoing nausea. Tenderness to palpation in right lower  quadrant is only increased or she's been here.  No improvement s/p zofran.   -She still appears ill. -She is not able to take anything by mouth. She is exhibiting evidence of dehydration not taking any by mouth now we'll need to send her to the emergency department for further fluids and workup. -May need ultrasound or imaging to look for evidence of appendicitis with right lower quadrant tenderness. -Calling EMS to transport to the emergency department.  45 minutes face time care for patient

## 2016-09-12 NOTE — Discharge Instructions (Signed)
Please read and follow all provided instructions.  Your diagnoses today include:  1. Viral syndrome     Tests performed today include: Blood counts and electrolytes Blood tests to check liver and kidney function Blood tests to check pancreas function Urine test to look for infection and pregnancy (in women) Vital signs. See below for your results today.   Medications prescribed:   Take any prescribed medications only as directed.  Home care instructions:  Follow any educational materials contained in this packet.  Follow-up instructions: Please follow-up with your primary care provider in the next 2 days for further evaluation of your symptoms.    Return instructions:  SEEK IMMEDIATE MEDICAL ATTENTION IF: The pain does not go away or becomes severe  A temperature above 101F develops  Repeated vomiting occurs (multiple episodes)  The pain becomes localized to portions of the abdomen. The right side could possibly be appendicitis. In an adult, the left lower portion of the abdomen could be colitis or diverticulitis.  Blood is being passed in stools or vomit (bright red or black tarry stools)  You develop chest pain, difficulty breathing, dizziness or fainting, or become confused, poorly responsive, or inconsolable (young children) If you have any other emergent concerns regarding your health  Additional Information: Abdominal (belly) pain can be caused by many things. Your caregiver performed an examination and possibly ordered blood/urine tests and imaging (CT scan, x-rays, ultrasound). Many cases can be observed and treated at home after initial evaluation in the emergency department. Even though you are being discharged home, abdominal pain can be unpredictable. Therefore, you need a repeated exam if your pain does not resolve, returns, or worsens. Most patients with abdominal pain don't have to be admitted to the hospital or have surgery, but serious problems like appendicitis and  gallbladder attacks can start out as nonspecific pain. Many abdominal conditions cannot be diagnosed in one visit, so follow-up evaluations are very important.  Your vital signs today were: BP 157/79    Pulse 88    Temp 98.1 F (36.7 C) (Oral)    Resp 25    Ht 5\' 3"  (1.6 m)    Wt 97.5 kg    SpO2 93%    BMI 38.09 kg/m  If your blood pressure (bp) was elevated above 135/85 this visit, please have this repeated by your doctor within one month. --------------

## 2016-09-12 NOTE — ED Triage Notes (Signed)
Pt states she has had flu like symtoms since Sunday and was [facility] being seen by urgent care. Pt had a negative flu swab yesterday, negative urinalysis today. Pt continues to have fevers, chills and nausea with vomiting since Sunday. Pt received 1L NS at urgent care. Pts lips are dry and was concern for dehydration.  Pt reports RLQ tenderness when palpated.

## 2016-09-12 NOTE — ED Notes (Signed)
ED Provider at bedside. 

## 2016-09-12 NOTE — Patient Instructions (Signed)
     IF you received an x-ray today, you will receive an invoice from [facility]. Please contact [facility] at [phone] with questions or concerns regarding your invoice.   IF you received labwork today, you will receive an invoice from [facility]. Please contact [facility] at [phone] with questions or concerns regarding your invoice.   Our billing staff will not be able to assist you with questions regarding bills from these companies.  You will be contacted with the lab results as soon as they are available. The fastest way to get your results is to activate your My Chart account. Instructions are located on the last page of this paperwork. If you have not heard from us regarding the results in 2 weeks, please contact this office.      

## 2016-09-12 NOTE — ED Provider Notes (Signed)
[facility]-EMERGENCY DEPT Provider Note   CSN: [id number] Arrival date & time: 09/12/16  1323  History   Chief Complaint Chief Complaint  Patient presents with  . Fever  . Nausea    HPI Pam LowensteinKim E Bradby is a 50 y.o. female.  HPI  50 y.o. female presents to the Emergency Department today complaining of N/V and fever since Sunday. Seen at UC earlier today and sent to ED for further evaluation. Notes cough and fevers over weekend and poss tussive emesis. Progressively got worse until today when she presented to UC. Fluids were given. Flu was negative. UA negative yesterday as well. Pt was sent home yesterday from UC with Dx viral illness. Pt states that she has been unable to tolerate much with PO intake due to nausea and decrease appetite. Denies abdominal pain. No CP/SOB. Noted RLQ tenderness at UC today, that she states was new for her. Pt with normal BMs. No diarrhea. No recent ABX. No other symptoms noted.   Past Medical History:  Diagnosis Date  . Varicose veins     Patient Active Problem List   Diagnosis Date Noted  . Varicose veins of left lower extremity with complications 04/11/2015  . Symptomatic varicose veins of both lower extremities 03/15/2015    Past Surgical History:  Procedure Laterality Date  . CESAREAN SECTION    . TONSILLECTOMY      OB History    No data available       Home Medications    Prior to Admission medications   Medication Sig Start Date End Date Taking? Authorizing Provider  cyclobenzaprine (FLEXERIL) 5 MG tablet Take 1 tablet (5 mg total) by mouth 3 (three) times daily as needed for muscle spasms. Patient not taking: Reported on 09/12/2016 02/25/13   Sondra Bargesyan M Dunn, PA-C  HYDROcodone-acetaminophen (NORCO/VICODIN) 5-325 MG per tablet Take 1 tablet by mouth every 8 (eight) hours as needed for pain. Patient not taking: Reported on 09/12/2016 02/21/13   Gwenlyn FoundJessica C Copland, MD  LORazepam (ATIVAN) 1 MG tablet Take 1 tablet (1 mg total) by mouth once. Take 1  tablet 60 minutes prior to the procedure. 04/07/15   Pryor OchoaJames D Lawson, MD    Family History Family History  Problem Relation Age of Onset  . Adopted: Yes    Social History Social History  Substance Use Topics  . Smoking status: Never Smoker  . Smokeless tobacco: Never Used  . Alcohol use No     Allergies   Bee venom   Review of Systems Review of Systems ROS reviewed and all are negative for acute change except as noted in the HPI  Physical Exam Updated Vital Signs BP 142/88 (BP Location: Right Arm)   Pulse 88   Temp 98.1 F (36.7 C) (Oral)   Resp 24   Ht 5\' 3"  (1.6 m)   Wt 97.5 kg   SpO2 96%   BMI 38.09 kg/m   Physical Exam  Constitutional: She is oriented to person, place, and time. Vital signs are normal. She appears well-developed and well-nourished.  HENT:  Head: Normocephalic and atraumatic.  Right Ear: Hearing normal.  Left Ear: Hearing normal.  Eyes: Conjunctivae and EOM are normal. Pupils are equal, round, and reactive to light.  Neck: Normal range of motion. Neck supple.  Cardiovascular: Normal rate, regular rhythm and intact distal pulses.   Murmur heard. Pulmonary/Chest: Effort normal and breath sounds normal. No respiratory distress.  Abdominal: Soft. Normal appearance and bowel sounds are normal. There is tenderness in  the right lower quadrant. There is tenderness at McBurney's point. There is no rebound, no guarding and negative Murphy's sign.  Musculoskeletal: Normal range of motion.  Neurological: She is alert and oriented to person, place, and time.  Skin: Skin is warm and dry.  Psychiatric: She has a normal mood and affect. Her speech is normal and behavior is normal. Thought content normal.  Nursing note and vitals reviewed.  ED Treatments / Results  Labs (all labs ordered are listed, but only abnormal results are displayed) Labs Reviewed  CBC - Abnormal; Notable for the following:       Result Value   WBC 17.3 (*)    All other components  within normal limits  COMPREHENSIVE METABOLIC PANEL - Abnormal; Notable for the following:    Glucose, Bld 104 (*)    All other components within normal limits  I-STAT BETA HCG BLOOD, ED ([facility], [facility], [facility] ONLY) - Abnormal; Notable for the following:    I-stat hCG, quantitative 7.3 (*)    All other components within normal limits  CULTURE, BLOOD (ROUTINE X 2)  CULTURE, BLOOD (ROUTINE X 2)  LIPASE, BLOOD   EKG  EKG Interpretation  Date/Time:  Wednesday September 12 2016 13:27:55 EST Ventricular Rate:  81 PR Interval:    QRS Duration: 90 QT Interval:  401 QTC Calculation: 466 R Axis:   35 Text Interpretation:  Sinus rhythm LVH with secondary repolarization abnormality No old tracing to compare Confirmed by CAMPOS  MD, KEVIN ([id number]) on 09/12/2016 1:31:43 PM      Radiology Dg Chest 2 View  Result Date: 09/12/2016 CLINICAL DATA:  Fever starting Saturday EXAM: CHEST  2 VIEW COMPARISON:  None. FINDINGS: Cardiomediastinal silhouette is unremarkable. No acute infiltrate or pleural effusion. No pulmonary edema. Bony thorax is unremarkable. IMPRESSION: No active cardiopulmonary disease. Electronically Signed   By: Natasha MeadLiviu  Pop M.D.   On: 09/12/2016 15:19   Ct Abdomen Pelvis W Contrast  Result Date: 09/12/2016 CLINICAL DATA:  Right lower quadrant abdominal pain, fever. EXAM: CT ABDOMEN AND PELVIS WITH CONTRAST TECHNIQUE: Multidetector CT imaging of the abdomen and pelvis was performed using the standard protocol following bolus administration of intravenous contrast. CONTRAST:  100mL ISOVUE-300 IOPAMIDOL (ISOVUE-300) INJECTION 61% COMPARISON:  None. FINDINGS: Lower chest: No acute abnormality. Hepatobiliary: No focal liver abnormality is seen. No gallstones, gallbladder wall thickening, or biliary dilatation. Pancreas: Unremarkable. No pancreatic ductal dilatation or surrounding inflammatory changes. Spleen: Normal in size without focal abnormality. Adrenals/Urinary Tract: Adrenal glands are  unremarkable. Kidneys are normal, without renal calculi, focal lesion, or hydronephrosis. Bladder is unremarkable. Stomach/Bowel: Stomach is within normal limits. Appendix appears normal. No evidence of bowel wall thickening, distention, or inflammatory changes. Vascular/Lymphatic: No significant vascular findings are present. No enlarged abdominal or pelvic lymph nodes. Reproductive: Uterus and bilateral adnexa are unremarkable. Other: No abdominal wall hernia or abnormality. No abdominopelvic ascites. Musculoskeletal: No acute or significant osseous findings. IMPRESSION: No definite abnormality seen in the abdomen or pelvis. Electronically Signed   By: Lupita RaiderJames  Green Jr, M.D.   On: 09/12/2016 15:00    Procedures Procedures (including critical care time)  Medications Ordered in ED Medications  sodium chloride 0.9 % bolus 1,000 mL (1,000 mLs Intravenous New Bag/Given 09/12/16 1412)  morphine 4 MG/ML injection 4 mg (4 mg Intravenous Given 09/12/16 1412)  iopamidol (ISOVUE-300) 61 % injection (100 mLs  Contrast Given 09/12/16 1428)   Initial Impression / Assessment and Plan / ED Course  I have reviewed the triage vital signs  and the nursing notes.  Pertinent labs & imaging results that were available during my care of the patient were reviewed by me and considered in my medical decision making (see chart for details).  Clinical Course    Final Clinical Impressions(s) / ED Diagnoses  {I have reviewed and evaluated the relevant laboratory values. {I have reviewed and evaluated the relevant imaging studies.  {I have reviewed the relevant previous healthcare records.  {I obtained HPI from historian. {Patient discussed with supervising physician.  ED Course:  Assessment: Pt is a 50yF who presents with nausea, vomiting, fever since Sunday. Seen at [facility] for same today. Sent for eval appendicitis. Urine dipstick and Flu negative at [facility] yesterday. On exam, pt in NAD. Nontoxic/nonseptic appearing. VSS.  Afebrile. Lungs CTA. Heart RRR. Abdomen TTP RLQ. CBC with leukocytosis 17.3. CT ABD/Pelvis unremarkable. CXR unremarkable. EKG unremarkable. Given fluids and analgesia in ED with improvement of symptoms. Given new heart murmur, will obtain blood cultures to screen for endocarditis. Pt does not have hx of IV drug abuse, so suspicion is low. Likely viral syndrome. Will notify patient if positive cultures. Given strict return precautions. DC pt home with follow up to PCP. At time of discharge, Patient is in no acute distress. Vital Signs are stable. Patient is able to ambulate. Patient able to tolerate PO  Disposition/Plan:  DC Home Additional Verbal discharge instructions given and discussed with patient.  Pt Instructed to f/u with PCP in the next week for evaluation and treatment of symptoms. Return precautions given Pt acknowledges and agrees with plan  Supervising Physician Azalia Bilis, MD  Final diagnoses:  Viral syndrome    New Prescriptions New Prescriptions   No medications on file        Audry Pili, PA-C 09/12/16 1553    Azalia Bilis, MD 09/12/16 [phone]

## 2016-09-12 NOTE — Progress Notes (Signed)
[facility]. Report given to charge nurse Johnny BridgeMartha

## 2016-09-13 NOTE — Progress Notes (Signed)
Called to check on patient. She states she is feeling better since she received fluids yesterday. She was given 1 sounds like anti-medics and medicine for her cough. She is able to drink better today. Discussed she should return to see us if she is not feeling better by next week. Return to see us sooner if any worsening. She appreciated the phone call.

## 2016-09-17 LAB — CULTURE, BLOOD (ROUTINE X 2)
Culture: NO GROWTH
Culture: NO GROWTH

## 2016-09-19 ENCOUNTER — Ambulatory Visit (INDEPENDENT_AMBULATORY_CARE_PROVIDER_SITE_OTHER): Payer: Self-pay | Admitting: Family Medicine

## 2016-09-19 VITALS — BP 116/82 | HR 92 | Temp 99.1°F | Resp 16 | Ht 63.0 in | Wt 212.0 lb

## 2016-09-19 DIAGNOSIS — H6691 Otitis media, unspecified, right ear: Secondary | ICD-10-CM

## 2016-09-19 DIAGNOSIS — J329 Chronic sinusitis, unspecified: Secondary | ICD-10-CM

## 2016-09-19 MED ORDER — AMOXICILLIN-POT CLAVULANATE 875-125 MG PO TABS: 1.0000 | ORAL_TABLET | Freq: Two times a day (BID) | ORAL | 0 refills | Status: DC

## 2016-09-19 MED ORDER — OFLOXACIN 0.3 % OT SOLN: 5.0000 [drp] | Freq: Every day | OTIC | 0 refills | Status: DC

## 2016-09-19 MED ORDER — FLUCONAZOLE 150 MG PO TABS: 150.0000 mg | ORAL_TABLET | Freq: Once | ORAL | 0 refills | Status: AC

## 2016-09-19 NOTE — Progress Notes (Signed)
Patient ID: Pam Mclaughlin, female    DOB: [birth year], 50 y.o.   MRN: [id number]  PCP: Ailene RavelHAMRICK,MAURA L, MD  Chief Complaint  Patient presents with  . Ear Pain    Right side - x 1 week     Subjective:   HPI 50 year old female presents for evaluation of persistent right ear pain for greater than 1 week. Still taking Mucinex and Flonase without relief of symptoms. Her nose is so badly congestion that with attempts to blow she is unable to produce nasal mucus. Right ear feels very full and tender. Denies throat pain or cough. Some intermittent headaches.  Social History   Social History  . Marital status: Married    Spouse name: N/A  . Number of children: N/A  . Years of education: N/A   Occupational History  . Not on file.   Social History Main Topics  . Smoking status: Never Smoker  . Smokeless tobacco: Never Used  . Alcohol use No  . Drug use: No  . Sexual activity: No   Other Topics Concern  . Not on file   Social History Narrative  . No narrative on file    Family History  Problem Relation Age of Onset  . Adopted: Yes    Review of Systems See HPI   Patient Active Problem List   Diagnosis Date Noted  . Varicose veins of left lower extremity with complications 04/11/2015  . Symptomatic varicose veins of both lower extremities 03/15/2015     Prior to Admission medications   Medication Sig Start Date End Date Taking? Authorizing Provider  benzonatate (TESSALON) 100 MG capsule Take 1 capsule (100 mg total) by mouth every 8 (eight) hours. 09/12/16  Yes Audry Piliyler Mohr, PA-C  pseudoephedrine-guaifenesin (MUCINEX D) 60-600 MG 12 hr tablet Take 1 tablet by mouth every 12 (twelve) hours. 09/12/16  Yes Audry Piliyler Mohr, PA-C  cyclobenzaprine (FLEXERIL) 5 MG tablet Take 1 tablet (5 mg total) by mouth 3 (three) times daily as needed for muscle spasms. Patient not taking: Reported on 09/19/2016 02/25/13   Sondra Bargesyan M Dunn, PA-C  HYDROcodone-acetaminophen (NORCO/VICODIN) 5-325 MG per  tablet Take 1 tablet by mouth every 8 (eight) hours as needed for pain. Patient not taking: Reported on 09/19/2016 02/21/13   Pearline CablesJessica C Copland, MD  ibuprofen (ADVIL,MOTRIN) 200 MG tablet Take 200 mg by mouth every 6 (six) hours as needed for fever.    Historical Provider, MD  LORazepam (ATIVAN) 1 MG tablet Take 1 tablet (1 mg total) by mouth once. Take 1 tablet 60 minutes prior to the procedure. Patient not taking: Reported on 09/19/2016 04/07/15   Pryor OchoaJames D Lawson, MD  ondansetron (ZOFRAN) 4 MG tablet Take 1 tablet (4 mg total) by mouth every 6 (six) hours. Patient not taking: Reported on 09/19/2016 09/12/16   Audry Piliyler Mohr, PA-C   Allergies  Allergen Reactions  . Bee Venom      Objective:  Physical Exam  Constitutional: She appears well-developed and well-nourished.  HENT:  Head: Normocephalic and atraumatic.  Right Ear: There is tenderness. Tympanic membrane is erythematous. A middle ear effusion is present.  Left Ear: A middle ear effusion is present.  Nose: Mucosal edema present.  Mouth/Throat: No oropharyngeal exudate, posterior oropharyngeal edema, posterior oropharyngeal erythema or tonsillar abscesses.  Cardiovascular: Normal rate, regular rhythm, normal heart sounds and intact distal pulses.   Pulmonary/Chest: Effort normal and breath sounds normal.  Skin: Skin is warm and dry.  Psychiatric: She has a normal mood  and affect. Her behavior is normal. Judgment and thought content normal.      Assessment & Plan:  1. Sinusitis, unspecified chronicity, unspecified location 2. Otitis of right ear  Plan:  . ofloxacin (FLOXIN OTIC) 0.3 % otic solution    Sig: Place 5 drops into the right ear daily.  Marland Kitchen. amoxicillin-clavulanate (AUGMENTIN) 875-125 MG tablet    Sig: Take 1 tablet by mouth 2 (two) times daily.    Dispense:  20 tablet  . fluconazole (DIFLUCAN) 150 MG tablet    Sig: Take 1 tablet (150 mg total) by mouth once.    Dispense:  1 tablet   Continue Flonase nasal spray once  daily for congestion.  Godfrey PickKimberly S. Tiburcio PeaHarris, MSN, FNP-C [facility]

## 2016-09-19 NOTE — Patient Instructions (Addendum)
Start Augmentin 1 tablet twice daily with food to avoid stomach upset. Complete all medication.  Take Diflucan 150 mg once if vaginal irritation develops  Use Floxin ear drops in right ear once- twice daily for 5 days.  Continue Flonase as needed.  IF you received an x-ray today, you will receive an invoice from [facility]. Please contact [facility] at [phone] with questions or concerns regarding your invoice.   IF you received labwork today, you will receive an invoice from [facility]. Please contact [facility] at [phone] with questions or concerns regarding your invoice.   Our billing staff will not be able to assist you with questions regarding bills from these companies.  You will be contacted with the lab results as soon as they are available. The fastest way to get your results is to activate your My Chart account. Instructions are located on the last page of this paperwork. If you have not heard from us regarding the results in 2 weeks, please contact this office.     Otitis Media, Adult Otitis media is redness, soreness, and inflammation of the middle ear. Otitis media may be caused by allergies or, most commonly, by infection. Often it occurs as a complication of the common cold. What are the signs or symptoms? Symptoms of otitis media may include:  Earache.  Fever.  Ringing in your ear.  Headache.  Leakage of fluid from the ear. How is this diagnosed? To diagnose otitis media, your health care provider will examine your ear with an otoscope. This is an instrument that allows your health care provider to see into your ear in order to examine your eardrum. Your health care provider also will ask you questions about your symptoms. How is this treated? Typically, otitis media resolves on its own within 3-5 days. Your health care provider may prescribe medicine to ease your symptoms of pain. If otitis media does not resolve within 5 days or is  recurrent, your health care provider may prescribe antibiotic medicines if he or she suspects that a bacterial infection is the cause. Follow these instructions at home:  If you were prescribed an antibiotic medicine, finish it all even if you start to feel better.  Take medicines only as directed by your health care provider.  Keep all follow-up visits as directed by your health care provider. Contact a health care provider if:  You have otitis media only in one ear, or bleeding from your nose, or both.  You notice a lump on your neck.  You are not getting better in 3-5 days.  You feel worse instead of better. Get help right away if:  You have pain that is not controlled with medicine.  You have swelling, redness, or pain around your ear or stiffness in your neck.  You notice that part of your face is paralyzed.  You notice that the bone behind your ear (mastoid) is tender when you touch it. This information is not intended to replace advice given to you by your health care provider. Make sure you discuss any questions you have with your health care provider. Document Released: 06/22/2004 Document Revised: 02/23/2016 Document Reviewed: 04/14/2013 Elsevier Interactive Patient Education  2017 [employer]

## 2017-09-26 ENCOUNTER — Ambulatory Visit (INDEPENDENT_AMBULATORY_CARE_PROVIDER_SITE_OTHER): Payer: Self-pay | Admitting: Family Medicine

## 2017-09-26 ENCOUNTER — Encounter: Payer: Self-pay | Admitting: Family Medicine

## 2017-09-26 ENCOUNTER — Other Ambulatory Visit: Payer: Self-pay

## 2017-09-26 VITALS — BP 154/99 | HR 70 | Temp 98.5°F | Ht 65.35 in | Wt 236.0 lb

## 2017-09-26 DIAGNOSIS — L301 Dyshidrosis [pompholyx]: Secondary | ICD-10-CM | POA: Insufficient documentation

## 2017-09-26 MED ORDER — BETAMETHASONE DIPROPIONATE 0.05 % EX CREA: TOPICAL_CREAM | Freq: Two times a day (BID) | CUTANEOUS | 0 refills | Status: DC

## 2017-09-26 MED ORDER — NONFORMULARY OR COMPOUNDED ITEM: 2 refills | Status: DC

## 2017-09-26 NOTE — Patient Instructions (Addendum)
   IF you received an x-ray today, you will receive an invoice from [facility]. Please contact [facility] at [phone] with questions or concerns regarding your invoice.   IF you received labwork today, you will receive an invoice from [facility]. Please contact [facility] at [phone] with questions or concerns regarding your invoice.   Our billing staff will not be able to assist you with questions regarding bills from these companies.  You will be contacted with the lab results as soon as they are available. The fastest way to get your results is to activate your My Chart account. Instructions are located on the last page of this paperwork. If you have not heard from us regarding the results in 2 weeks, please contact this office.     Dyshidrotic Eczema Dyshidrotic eczema (pompholyx) is a type of eczema that causes very itchy (pruritic), fluid-filled blisters (vesicles) to form on the hands and feet. It can affect people of any age, but is more common before the age of 40. There is no cure, but treatment and certain lifestyle changes can help relieve symptoms. What are the causes? The cause of this condition is not known. What increases the risk? You are more likely to develop this condition if:  You wash your hands frequently.  You have a personal history or family history of eczema, allergies, asthma, or hay fever.  You are allergic to metals such as nickel or cobalt.  You work with cement.  You smoke.  What are the signs or symptoms? Symptoms of this condition may affect the hands, feet, or both. Symptoms may come and go (recur), and may include:  Severe itching, which may happen before blisters appear.  Blisters. These may form suddenly. ? In the early stages, blisters may form near the fingertips. ? In severe cases, blisters may grow to large blister masses (bullae). ? Blisters resolve in 2-3 weeks without bursting. This is followed by a dry phase  in which itching eases.  Pain and swelling.  Cracks or long, narrow openings (fissures) in the skin.  Severe dryness.  Ridges on the nails.  How is this diagnosed? This condition may be diagnosed based on:  A physical exam.  Your symptoms.  Your medical history.  Skin scrapings to rule out a fungal infection.  Testing a swab of fluid for bacteria (culture).  Removing and checking a small piece of skin (biopsy) in order to test for infection or to rule out other conditions.  Skin patch tests. These tests involve taking patches that contain possible allergens and placing them on your back. Your health care provider will wait a few days and then check to see if an allergic reaction occurred. These tests may be done if your health care provider suspects allergic reactions, or to rule out other types of eczema.  You may be referred to a health care provider who specializes in the skin (dermatologist) to help diagnose and treat this condition. How is this treated? There is no cure for this condition, but treatment can help relieve symptoms. Depending on how many blisters you have and how severe they are, your health care provider may suggest:  Avoiding allergens, irritants, or triggers that worsen symptoms. This may involve lifestyle changes such as: ? Using different lotions or soaps. ? Avoiding hot weather or places that will cause you to sweat a lot. ? Managing stress with coping techniques such as relaxation and exercise, and asking for help when you need it. ? Diet   changes as recommended by your health care provider.  Using a clean, damp towel (cool compress) to relieve symptoms.  Soaking in a bath that contains a type of salt that relieves irritation (aluminum acetate soaks).  Medicine taken by mouth to reduce itching (oral antihistamines).  Medicine applied to the skin to reduce swelling and irritation (topical corticosteroids).  Medicine that reduces the activity of the  body's disease-fighting system (immunosuppressants) to treat inflammation. This may be given in severe cases.  Antibiotic medicines to treat bacterial infection.  Light therapy (phototherapy). This involves shining ultraviolet (UV) light on affected skin in order to reduce itchiness and inflammation.  Follow these instructions at home: Bathing and skin care  Wash skin gently. After bathing or washing your hands, pat your skin dry. Avoid rubbing your skin.  Remove all jewelry before bathing. If the skin under the jewelry stays wet, blisters may form or get worse.  Apply cool compresses as told by your health care provider: ? Soak a clean towel in cool water. ? Wring out excess water until towel is damp. ? Place the towel over affected skin. Leave the towel on for 20 minutes at a time, 2-3 times a day.  Use mild soaps, cleansers, and lotions that do not contain dyes, perfumes, or other irritants.  Keep your skin hydrated. To do this: ? Avoid very hot water. Take lukewarm baths or showers. ? Apply moisturizer within three minutes of bathing. This locks in moisture. Medicines  Take and apply over-the-counter and prescription medicines only as told by your health care provider.  If you were prescribed antibiotic medicine, take or apply it as told by your health care provider. Do not stop using the antibiotic even if you start to feel better. General instructions  Identify and avoid triggers and allergens.  Keep fingernails short to avoid breaking open the skin while scratching.  Use waterproof gloves to protect your hands when doing work that keeps your hands wet for a long time.  Wear socks to keep your feet dry.  Do not use any products that contain nicotine or tobacco, such as cigarettes and e-cigarettes. If you need help quitting, ask your health care provider.  Keep all follow-up visits as told by your health care provider. This is important. Contact a health care provider  if:  You have symptoms that do not go away.  You have signs of infection, such as: ? Crusting, pus, or a bad smell. ? More redness, swelling, or pain. ? Increased warmth in the affected area. Summary  Dyshidrotic eczema (pompholyx) is a type of eczema that causes very itchy (pruritic), fluid-filled blisters (vesicles) to form on the hands and feet.  The cause of this condition is not known.  There is no cure for this condition, but treatment can help relieve symptoms. Treatment depends on how many blisters you have and how severe they are.  Use mild soaps, cleansers, and lotions that do not contain dyes, perfumes, or other irritants. Keep your skin hydrated. This information is not intended to replace advice given to you by your health care provider. Make sure you discuss any questions you have with your health care provider. Document Released: 01/31/2017 Document Revised: 01/31/2017 Document Reviewed: 01/31/2017 Elsevier Interactive Patient Education  2018 [employer]vier Inc.  

## 2017-09-26 NOTE — Progress Notes (Signed)
   12/27/20183:48 PM  Pam LowensteinKim E Mclaughlin [birth year], 51 y.o. female [id number]  Chief Complaint  Patient presents with  . Rash    on hands    HPI:   Patient is a 51 y.o. female with past medical history significant for eczema who presents today for medication refill. Does really well with topical steroids as needed and a compound emollient with gloves at night. She has no other concerns today.   [facility] 09/26/2017 09/19/2016 09/12/2016  Decreased Interest 0 0 0  Down, Depressed, Hopeless 0 0 0  PHQ - 2 Score 0 0 0    Allergies  Allergen Reactions  . Bee Venom     Prior to Admission medications   Not on File    Past Medical History:  Diagnosis Date  . Varicose veins     Past Surgical History:  Procedure Laterality Date  . CESAREAN SECTION    . TONSILLECTOMY      Social History   Tobacco Use  . Smoking status: Never Smoker  . Smokeless tobacco: Never Used  Substance Use Topics  . Alcohol use: No    Alcohol/week: 12.6 oz    Types: 21 Cans of beer per week    Family History  Adopted: Yes    ROS Per hpi  OBJECTIVE:  Blood pressure (!) 154/99, pulse 70, temperature 98.5 F (36.9 C), temperature source Oral, height 5' 5.35" (1.66 m), weight 236 lb (107 kg), SpO2 98 %.  Physical Exam  Gen: AAOx3, NAD Skin: bilateral blisters, cracking, erythema mostly along sides of fingers and around nail beds    ASSESSMENT and PLAN  1. Eczema, dyshidrotic Cont with current skin regime.  - betamethasone dipropionate (DIPROLENE) 0.05 % cream; Apply topically 2 (two) times daily. - NONFORMULARY OR COMPOUNDED ITEM; 1:1:1 salicylic acid/LCD/aquaphor. Apply to affected skin three times a day as needed for dry cracking skin. Dispense 1 jar.  Return if symptoms worsen or fail to improve.    Pam LippsIrma M Santiago, MD Primary Care at [facility] [address] [place], [place] [id number] Ph.  [phone] Fax [phone]

## 2017-09-28 ENCOUNTER — Encounter: Payer: Self-pay | Admitting: Physician Assistant

## 2017-09-28 ENCOUNTER — Other Ambulatory Visit: Payer: Self-pay

## 2017-09-28 ENCOUNTER — Ambulatory Visit: Payer: Self-pay | Admitting: Physician Assistant

## 2017-09-28 VITALS — BP 134/84 | HR 68 | Temp 98.3°F | Resp 16 | Ht 63.0 in | Wt 236.0 lb

## 2017-09-28 DIAGNOSIS — R21 Rash and other nonspecific skin eruption: Secondary | ICD-10-CM

## 2017-09-28 LAB — POCT GLYCOSYLATED HEMOGLOBIN (HGB A1C): HEMOGLOBIN A1C: 5.9

## 2017-09-28 LAB — POCT SEDIMENTATION RATE: POCT SED RATE: 9 mm/hr (ref 0–22)

## 2017-09-28 MED ORDER — PREDNISONE 20 MG PO TABS: ORAL_TABLET | ORAL | 0 refills | Status: AC

## 2017-09-28 NOTE — Patient Instructions (Signed)
     IF you received an x-ray today, you will receive an invoice from [facility]. Please contact [facility] at [phone] with questions or concerns regarding your invoice.   IF you received labwork today, you will receive an invoice from [facility]. Please contact [facility] at [phone] with questions or concerns regarding your invoice.   Our billing staff will not be able to assist you with questions regarding bills from these companies.  You will be contacted with the lab results as soon as they are available. The fastest way to get your results is to activate your My Chart account. Instructions are located on the last page of this paperwork. If you have not heard from us regarding the results in 2 weeks, please contact this office.     

## 2017-09-28 NOTE — Progress Notes (Signed)
    10/06/2017 8:05 AM   DOB: October 30, 1965 / MRN: [id number]  SUBJECTIVE:  Pam Mclaughlin is a 51 y.o. female presenting for rash about the face.  No new meds. No lip, tongue or throat swelling.  The rash is itchy.  She works at an [employer]. Has a history of eczema. No new exposures. No oropharyngeal swelling.   She is allergic to bee venom.   She  has a past medical history of Varicose veins.    She  reports that  has never smoked. she has never used smokeless tobacco. She reports that she does not drink alcohol or use drugs. She  reports that she does not engage in sexual activity. The patient  has a past surgical history that includes Cesarean section and Tonsillectomy.  Her family history is not on file. She was adopted.  ROS  Per HPI.   The problem list and medications were reviewed and updated by myself where necessary and exist elsewhere in the encounter.   OBJECTIVE:  BP 134/84   Pulse 68   Temp 98.3 F (36.8 C) (Oral)   Resp 16   Ht 5\' 3"  (1.6 m)   Wt 236 lb (107 kg)   SpO2 97%   BMI 41.81 kg/m   Physical Exam  Constitutional: She is active.  Non-toxic appearance.  Cardiovascular: Normal rate.  Pulmonary/Chest: Effort normal. No tachypnea.  Neurological: She is alert.  Skin: Skin is warm and dry. Rash (wheel and flare, non tender, bilateral without vesicles) noted. She is not diaphoretic. No pallor.      No results found for this or any previous visit (from the past 72 hour(s)).  No results found.  ASSESSMENT AND PLAN:  1. Facial rash: Most likely an unknown allergic exposure.  No red flags.  No diabetes. Will treat with pred.  - POCT SEDIMENTATION RATE - POCT glycosylated hemoglobin (Hb A1C)   Meds ordered this encounter  Medications  . predniSONE (DELTASONE) 20 MG tablet    Sig: Take 3 in the morning for 3 days, then 2 in the morning for 3 days, and then 1 in the morning for 3 days.    Dispense:  18 tablet    Refill:  0    Order Specific  Question:   Supervising Provider    Answer:   Neva SeatGREENE, JEFFREY R [[id number]]       The patient is advised to call or return to clinic if she does not see an improvement in symptoms, or to seek the care of the closest emergency department if she worsens with the above plan.   Deliah BostonMichael Clark, MHS, PA-C Primary Care at [facility] 10/06/2017 8:05 AM

## 2017-10-04 ENCOUNTER — Telehealth: Payer: Self-pay | Admitting: Physician Assistant

## 2017-10-04 NOTE — Telephone Encounter (Signed)
Eber Jonesarolyn with [facility] called in needing clarification on a cream Rx that was written by Deliah BostonMichael Clark. They need to know the percentage of salicylic acid, the percentage of LCD, and the total volume of Aquaphor you want it mixed in. Thanks.   They can be reached at [phone].

## 2017-10-05 NOTE — Telephone Encounter (Signed)
I did not prescribe a cream. Please investigate the chart further.

## 2017-10-05 NOTE — Telephone Encounter (Signed)
[facility], originally compounded rx  betamethasome ointment 0.05% - 30 grams aquaphor 7.2 grms salicyclic acid 0.8 grams Coal tar 2ml Total was 40 grams  I [facility] aquaphor 50 grams salicyclic acid 5.6 grams Coal tar 14 ml Total was 60 grams with 1 refill

## 2017-11-06 ENCOUNTER — Encounter (HOSPITAL_COMMUNITY): Payer: Self-pay | Admitting: Emergency Medicine

## 2017-11-06 ENCOUNTER — Ambulatory Visit (INDEPENDENT_AMBULATORY_CARE_PROVIDER_SITE_OTHER): Payer: Self-pay | Admitting: Emergency Medicine

## 2017-11-06 VITALS — BP 114/74 | HR 76

## 2017-11-06 DIAGNOSIS — M542 Cervicalgia: Secondary | ICD-10-CM | POA: Insufficient documentation

## 2017-11-06 DIAGNOSIS — R011 Cardiac murmur, unspecified: Secondary | ICD-10-CM

## 2017-11-06 DIAGNOSIS — R55 Syncope and collapse: Secondary | ICD-10-CM

## 2017-11-06 DIAGNOSIS — R51 Headache: Secondary | ICD-10-CM | POA: Insufficient documentation

## 2017-11-06 DIAGNOSIS — S0990XA Unspecified injury of head, initial encounter: Secondary | ICD-10-CM

## 2017-11-06 DIAGNOSIS — Z79899 Other long term (current) drug therapy: Secondary | ICD-10-CM | POA: Insufficient documentation

## 2017-11-06 DIAGNOSIS — R61 Generalized hyperhidrosis: Secondary | ICD-10-CM | POA: Insufficient documentation

## 2017-11-06 LAB — BASIC METABOLIC PANEL
Anion gap: 12 (ref 5–15)
BUN: 12 mg/dL (ref 6–20)
CHLORIDE: 101 mmol/L (ref 101–111)
CO2: 24 mmol/L (ref 22–32)
CREATININE: 0.65 mg/dL (ref 0.44–1.00)
Calcium: 9.2 mg/dL (ref 8.9–10.3)
Glucose, Bld: 151 mg/dL — ABNORMAL HIGH (ref 65–99)
POTASSIUM: 4.1 mmol/L (ref 3.5–5.1)
SODIUM: 137 mmol/L (ref 135–145)

## 2017-11-06 LAB — URINALYSIS, ROUTINE W REFLEX MICROSCOPIC
Bilirubin Urine: NEGATIVE
Glucose, UA: NEGATIVE mg/dL
Hgb urine dipstick: NEGATIVE
KETONES UR: NEGATIVE mg/dL
LEUKOCYTES UA: NEGATIVE
NITRITE: NEGATIVE
PROTEIN: NEGATIVE mg/dL
Specific Gravity, Urine: 1.017 (ref 1.005–1.030)
pH: 5 (ref 5.0–8.0)

## 2017-11-06 LAB — CBC
HCT: 41.9 % (ref 36.0–46.0)
Hemoglobin: 13.8 g/dL (ref 12.0–15.0)
MCH: 29.7 pg (ref 26.0–34.0)
MCHC: 32.9 g/dL (ref 30.0–36.0)
MCV: 90.3 fL (ref 78.0–100.0)
PLATELETS: 333 10*3/uL (ref 150–400)
RBC: 4.64 MIL/uL (ref 3.87–5.11)
RDW: 13 % (ref 11.5–15.5)
WBC: 13.7 10*3/uL — AB (ref 4.0–10.5)

## 2017-11-06 LAB — I-STAT BETA HCG BLOOD, ED (MC, WL, AP ONLY): HCG, QUANTITATIVE: 5.9 m[IU]/mL — AB (ref <5)

## 2017-11-06 LAB — PREGNANCY, URINE: PREG TEST UR: NEGATIVE

## 2017-11-06 NOTE — Progress Notes (Signed)
Wt Readings from Last 3 Encounters:  09/28/17 236 lb (107 kg)  09/26/17 236 lb (107 kg)  09/19/16 212 lb (96.2 kg)   Temp Readings from Last 3 Encounters:  09/28/17 98.3 F (36.8 C) (Oral)  09/26/17 98.5 F (36.9 C) (Oral)  09/19/16 99.1 F (37.3 C) (Oral)   BP Readings from Last 3 Encounters:  09/28/17 134/84  09/26/17 (!) 154/99  09/19/16 116/82   Pulse Readings from Last 3 Encounters:  09/28/17 68  09/26/17 70  09/19/16 92

## 2017-11-06 NOTE — ED Triage Notes (Signed)
States that she was getting her hair done and she had a syncopal episode. Per daughter during the 60second episode she had snoring respirations, fell forward and hit her head and had an episode of vomiting directly after. Denies incontinence.

## 2017-11-06 NOTE — Patient Instructions (Signed)
[facility] immediately.

## 2017-11-06 NOTE — Progress Notes (Signed)
Pam LowensteinKim E Mclaughlin 52 y.o.   Chief Complaint  Patient presents with  . Head Injury    per patient daughter, patient was sitting in a chair and felt dizzy, patient fell back and was caught by daughter. Patient daughter reports patient making a "snoring" sound, then vomiting a yellow liquid. Patient then fell forward hitting R side of head/face    HISTORY OF PRESENT ILLNESS: This is a 52 y.o. female with no significant past medical history brought in by daughters after she passed out at home and hit the front of her head on the floor.  She was sitting down on a stool while daughter was working her hair when she suddenly started feeling lightheaded.  Daughter noticed her head went back, she made a loud snore noise, may have had seizure activity, vomited once, fell forward onto the floor hitting the right side of her face.  Patient does not remember event.  Was not postictal.  Now she states that she feels lightheaded and sleepy.  HPI   Prior to Admission medications   Medication Sig Start Date End Date Taking? Authorizing Provider  betamethasone dipropionate (DIPROLENE) 0.05 % cream Apply topically 2 (two) times daily. Patient not taking: Reported on 09/28/2017 09/26/17   Myles LippsSantiago, Irma M, MD  NONFORMULARY OR COMPOUNDED ITEM 1:1:1 salicylic acid/LCD/aquaphor. Apply to affected skin three times a day as needed for dry cracking skin. Dispense 1 jar. Patient not taking: Reported on 09/28/2017 09/26/17   Myles LippsSantiago, Irma M, MD    Allergies  Allergen Reactions  . Bee Venom     Patient Active Problem List   Diagnosis Date Noted  . Eczema, dyshidrotic 09/26/2017  . Varicose veins of left lower extremity with complications 04/11/2015  . Symptomatic varicose veins of both lower extremities 03/15/2015    Past Medical History:  Diagnosis Date  . Varicose veins     Past Surgical History:  Procedure Laterality Date  . CESAREAN SECTION    . TONSILLECTOMY      Social History   Socioeconomic  History  . Marital status: Married    Spouse name: Not on file  . Number of children: Not on file  . Years of education: Not on file  . Highest education level: Not on file  Social Needs  . Financial resource strain: Not on file  . Food insecurity - worry: Not on file  . Food insecurity - inability: Not on file  . Transportation needs - medical: Not on file  . Transportation needs - non-medical: Not on file  Occupational History  . Not on file  Tobacco Use  . Smoking status: Never Smoker  . Smokeless tobacco: Never Used  Substance and Sexual Activity  . Alcohol use: No    Alcohol/week: 12.6 oz    Types: 21 Cans of beer per week  . Drug use: No  . Sexual activity: No    Birth control/protection: None  Other Topics Concern  . Not on file  Social History Narrative  . Not on file    Family History  Adopted: Yes     Review of Systems  Constitutional: Negative.  Negative for chills and fever.  HENT: Negative.   Eyes: Negative.  Negative for blurred vision and double vision.  Respiratory: Negative.  Negative for cough and shortness of breath.   Cardiovascular: Negative.  Negative for chest pain and palpitations.  Gastrointestinal: Negative for abdominal pain, diarrhea, nausea and vomiting.  Genitourinary: Negative.   Musculoskeletal: Negative.   Skin: Negative  for rash.  Neurological: Positive for dizziness and headaches.  Endo/Heme/Allergies: Negative.   All other systems reviewed and are negative.   Vitals:   11/06/17 1350  BP: 114/74  Pulse: 76  SpO2: 94%    Physical Exam  Constitutional: She is oriented to person, place, and time. She appears well-developed and well-nourished.  HENT:  Head: Normocephalic and atraumatic.  Right Ear: External ear normal. No hemotympanum.  Left Ear: External ear normal. No hemotympanum.  Nose: Nose normal.  Mouth/Throat: Oropharynx is clear and moist.  Eyes: Conjunctivae and EOM are normal. Pupils are equal, round, and  reactive to light.  Neck: Normal range of motion. Neck supple.  Cardiovascular: Normal rate and regular rhythm.  Murmur (Aortic) heard. Pulmonary/Chest: Effort normal and breath sounds normal. No respiratory distress.  Abdominal: Soft. She exhibits no distension. There is no tenderness.  Musculoskeletal: Normal range of motion.  Lymphadenopathy:    She has no cervical adenopathy.  Neurological: She is alert and oriented to person, place, and time. No cranial nerve deficit or sensory deficit. She exhibits normal muscle tone. Coordination normal.  Skin: Skin is warm and dry. Capillary refill takes less than 2 seconds. No rash noted.  Mild soft tissue swelling with abrasion to right forehead and right cheek area.  Psychiatric: She has a normal mood and affect. Her behavior is normal.  Vitals reviewed.    ASSESSMENT & PLAN: Shalayah was seen today for head injury.  Diagnoses and all orders for this visit:  Syncope and collapse  Acute head injury, initial encounter  Heart murmur Comments: suspected aortic stenosis   Patient Instructions  Please go to [facility] emergency room immediately.     Edwina Barth, MD [facility]

## 2017-11-07 ENCOUNTER — Emergency Department (HOSPITAL_COMMUNITY): Payer: Self-pay

## 2017-11-07 ENCOUNTER — Emergency Department (HOSPITAL_COMMUNITY)
Admission: EM | Admit: 2017-11-07 | Discharge: 2017-11-07 | Disposition: A | Discharge status: Home / Self Care | Payer: Self-pay | Attending: Emergency Medicine | Admitting: Emergency Medicine

## 2017-11-07 DIAGNOSIS — R55 Syncope and collapse: Secondary | ICD-10-CM

## 2017-11-07 MED ORDER — OXYCODONE-ACETAMINOPHEN 5-325 MG PO TABS
1.0000 | ORAL_TABLET | Freq: Once | ORAL | Status: AC
  Administered 2017-11-07: 1 via ORAL
  Filled 2017-11-07: qty 1

## 2017-11-07 MED ORDER — ONDANSETRON 4 MG PO TBDP
4.0000 mg | ORAL_TABLET | Freq: Once | ORAL | Status: AC
  Administered 2017-11-07: 4 mg via ORAL
  Filled 2017-11-07: qty 1

## 2017-11-07 NOTE — ED Notes (Signed)
ED Provider at bedside. 

## 2017-11-07 NOTE — ED Notes (Signed)
Daughter reports pt vomited once prior to hitting floor. Pt does not remember vomiting. Pt reports some nausea. Pt reports some dizziness upon walking. Pt is alert and oriented x4.

## 2017-11-07 NOTE — Discharge Instructions (Signed)
Your labs and imaging studies today looked good. Recommend to follow-up closely with the wellness clinic to have ongoing evaluation for heart murmur. Return to the ED for new or worsening symptoms.

## 2017-11-07 NOTE — ED Provider Notes (Signed)
[facility] EMERGENCY DEPARTMENT Provider Note   CSN: [id number] Arrival date & time: 11/06/17  1446     History   Chief Complaint Chief Complaint  Patient presents with  . Loss of Consciousness    HPI Pam Mclaughlin is a 52 y.o. female.  The history is provided by the patient and medical records.  Loss of Consciousness      52 year old female with history of varicose veins, heart murmur, history of syncope several years ago, presenting to the ED after syncopal event.  Patient reports she was at hair salon getting her hair highlighted and while they were placing foils into her hair when she began to feel very flushed, sweaty, and stated she did not feel well.  States she was given a water bottle of something to drink, reached out for it but collapsed on the floor.  She fell from the chair onto her right side onto tile floor.  Daughter states they rolled her onto her side and she threw up once, appeared to be yellow liquid.  She was unconscious for about 60 seconds, had snoring respirations in this.  There was no tremors or seizure activity witnessed.  There is no bowel or bladder incontinence.  Patient states she got back into the chair to finish her highlights but felt very tired and drowsy.  States she went to the urgent care and was evaluated but sent here because she was told she needed a CT scan of her head.  Currently she has headache, neck pain, and a lot of soreness along right side of her face.  Reports she does have history of syncope several years ago without determine cause.  She does have history of heart murmur, unsure what kind.  States this is diagnosed at urgent care but she has not had any formal cardiac evaluation for this.  She is not a smoker.  No other known cardiac history.  No recent chest pain, SOB, exertional pains, etc.  Past Medical History:  Diagnosis Date  . Varicose veins     Patient Active Problem List   Diagnosis Date Noted  . Syncope  and collapse 11/06/2017  . Acute head injury 11/06/2017  . Heart murmur 11/06/2017  . Eczema, dyshidrotic 09/26/2017  . Varicose veins of left lower extremity with complications 04/11/2015  . Symptomatic varicose veins of both lower extremities 03/15/2015    Past Surgical History:  Procedure Laterality Date  . CESAREAN SECTION    . TONSILLECTOMY      OB History    No data available       Home Medications    Prior to Admission medications   Medication Sig Start Date End Date Taking? Authorizing Provider  betamethasone dipropionate (DIPROLENE) 0.05 % cream Apply topically 2 (two) times daily. Patient not taking: Reported on 09/28/2017 09/26/17   Myles Lipps, MD  NONFORMULARY OR COMPOUNDED ITEM 1:1:1 salicylic acid/LCD/aquaphor. Apply to affected skin three times a day as needed for dry cracking skin. Dispense 1 jar. Patient not taking: Reported on 09/28/2017 09/26/17   Myles Lipps, MD    Family History Family History  Adopted: Yes    Social History Social History   Tobacco Use  . Smoking status: Never Smoker  . Smokeless tobacco: Never Used  Substance Use Topics  . Alcohol use: No    Alcohol/week: 12.6 oz    Types: 21 Cans of beer per week  . Drug use: No     Allergies  Bee venom   Review of Systems Review of Systems  Cardiovascular: Positive for syncope.  Neurological: Positive for syncope.  All other systems reviewed and are negative.    Physical Exam Updated Vital Signs BP (!) 153/94   Pulse 69   Temp 98.4 F (36.9 C) (Oral)   Resp 18   Ht 5\' 3"  (1.6 m)   Wt 99.8 kg (220 lb)   SpO2 97%   BMI 38.97 kg/m   Physical Exam  Constitutional: She is oriented to person, place, and time. She appears well-developed and well-nourished. No distress.  HENT:  Head: Normocephalic and atraumatic.  Right Ear: External ear normal.  Left Ear: External ear normal.  Mouth/Throat: Oropharynx is clear and moist.  Bruising surrounding right eye;  orbital rim is tender without noted deformity; mid-face stable; bridge of nose non-tender; dentition intact; no tongue/lip laceration  Eyes: Conjunctivae and EOM are normal. Pupils are equal, round, and reactive to light.  PERRL  Neck: Normal range of motion and full passive range of motion without pain. Neck supple. No neck rigidity.  No rigidity, no meningismus  Cardiovascular: Normal rate and regular rhythm.  Murmur heard. Pulmonary/Chest: Effort normal and breath sounds normal. No stridor. No respiratory distress. She has no wheezes. She has no rhonchi.  Abdominal: Soft. Bowel sounds are normal. There is no tenderness. There is no rebound and no guarding.  Musculoskeletal: Normal range of motion. She exhibits no edema.  Neurological: She is alert and oriented to person, place, and time. She has normal strength. She displays no tremor. No cranial nerve deficit or sensory deficit. She displays no seizure activity.  AAOx3, answering questions and following commands appropriately; equal strength UE and LE bilaterally; CN grossly intact; moves all extremities appropriately without ataxia; no focal neuro deficits or facial asymmetry appreciated  Skin: Skin is warm and dry. No rash noted. She is not diaphoretic.  Psychiatric: She has a normal mood and affect. Her behavior is normal. Thought content normal.  Nursing note and vitals reviewed.    ED Treatments / Results  Labs (all labs ordered are listed, but only abnormal results are displayed) Labs Reviewed  BASIC METABOLIC PANEL - Abnormal; Notable for the following components:      Result Value   Glucose, Bld 151 (*)    All other components within normal limits  CBC - Abnormal; Notable for the following components:   WBC 13.7 (*)    All other components within normal limits  I-STAT BETA HCG BLOOD, ED ([facility], [facility], [facility] ONLY) - Abnormal; Notable for the following components:   I-stat hCG, quantitative 5.9 (*)    All other components within  normal limits  URINALYSIS, ROUTINE W REFLEX MICROSCOPIC  PREGNANCY, URINE    EKG  EKG Interpretation  Date/Time:  Wednesday November 06 2017 16:04:27 EST Ventricular Rate:  74 PR Interval:  150 QRS Duration: 86 QT Interval:  426 QTC Calculation: 472 R Axis:   40 Text Interpretation:  Normal sinus rhythm Left ventricular hypertrophy Abnormal ECG Confirmed by Zadie Rhine ([id number]) on 11/07/2017 3:41:42 AM       Radiology Ct Head Wo Contrast  Result Date: 11/07/2017 CLINICAL DATA:  Acute onset of syncope. Larey Seat forward and hit head on tile floor. Forehead and facial bruising. Vomiting. Concern for cervical spine injury. EXAM: CT HEAD WITHOUT CONTRAST CT MAXILLOFACIAL WITHOUT CONTRAST CT CERVICAL SPINE WITHOUT CONTRAST TECHNIQUE: Multidetector CT imaging of the head, cervical spine, and maxillofacial structures were performed using the standard protocol without  intravenous contrast. Multiplanar CT image reconstructions of the cervical spine and maxillofacial structures were also generated. COMPARISON:  None. FINDINGS: CT HEAD FINDINGS Brain: No evidence of acute infarction, hemorrhage, hydrocephalus, extra-axial collection or mass lesion/mass effect. Mild cerebellar atrophy is noted. The brainstem and fourth ventricle are within normal limits. The third and lateral ventricles, and basal ganglia are unremarkable in appearance. The cerebral hemispheres are symmetric in appearance, with normal gray-white differentiation. No mass effect or midline shift is seen. Vascular: No hyperdense vessel or unexpected calcification. Skull: There is no evidence of fracture; visualized osseous structures are unremarkable in appearance. Other: No significant soft tissue abnormalities are seen. CT MAXILLOFACIAL FINDINGS Osseous: There is no evidence of fracture or dislocation. The maxilla and mandible appear intact. The nasal bone is unremarkable in appearance. The visualized dentition demonstrates no acute  abnormality. Orbits: The orbits are intact bilaterally. Sinuses: The visualized paranasal sinuses and mastoid air cells are well-aerated. Soft tissues: No significant soft tissue abnormalities are seen. The parapharyngeal fat planes are preserved. The nasopharynx, oropharynx and hypopharynx are unremarkable in appearance. The visualized portions of the valleculae and piriform sinuses are grossly unremarkable. The parotid and submandibular glands are within normal limits. No cervical lymphadenopathy is seen. CT CERVICAL SPINE FINDINGS Alignment: Normal. Skull base and vertebrae: No acute fracture. No primary bone lesion or focal pathologic process. Soft tissues and spinal canal: No prevertebral fluid or swelling. No visible canal hematoma. Disc levels: Intervertebral disc spaces are preserved. The bony foramina are grossly unremarkable. Upper chest: The thyroid gland is unremarkable in appearance. Mild calcification is noted at the carotid bifurcations bilaterally. The visualized lung apices are clear. Other: No additional soft tissue abnormalities are seen. IMPRESSION: 1. No evidence of traumatic intracranial injury or fracture. 2. No evidence of fracture or subluxation along the cervical spine. 3. No evidence of fracture or dislocation with regard to the maxillofacial structures. 4. Mild calcification at the carotid bifurcations bilaterally. Electronically Signed   By: Roanna Raider M.D.   On: 11/07/2017 04:32   Ct Cervical Spine Wo Contrast  Result Date: 11/07/2017 CLINICAL DATA:  Acute onset of syncope. Larey Seat forward and hit head on tile floor. Forehead and facial bruising. Vomiting. Concern for cervical spine injury. EXAM: CT HEAD WITHOUT CONTRAST CT MAXILLOFACIAL WITHOUT CONTRAST CT CERVICAL SPINE WITHOUT CONTRAST TECHNIQUE: Multidetector CT imaging of the head, cervical spine, and maxillofacial structures were performed using the standard protocol without intravenous contrast. Multiplanar CT image  reconstructions of the cervical spine and maxillofacial structures were also generated. COMPARISON:  None. FINDINGS: CT HEAD FINDINGS Brain: No evidence of acute infarction, hemorrhage, hydrocephalus, extra-axial collection or mass lesion/mass effect. Mild cerebellar atrophy is noted. The brainstem and fourth ventricle are within normal limits. The third and lateral ventricles, and basal ganglia are unremarkable in appearance. The cerebral hemispheres are symmetric in appearance, with normal gray-white differentiation. No mass effect or midline shift is seen. Vascular: No hyperdense vessel or unexpected calcification. Skull: There is no evidence of fracture; visualized osseous structures are unremarkable in appearance. Other: No significant soft tissue abnormalities are seen. CT MAXILLOFACIAL FINDINGS Osseous: There is no evidence of fracture or dislocation. The maxilla and mandible appear intact. The nasal bone is unremarkable in appearance. The visualized dentition demonstrates no acute abnormality. Orbits: The orbits are intact bilaterally. Sinuses: The visualized paranasal sinuses and mastoid air cells are well-aerated. Soft tissues: No significant soft tissue abnormalities are seen. The parapharyngeal fat planes are preserved. The nasopharynx, oropharynx and hypopharynx are unremarkable in  appearance. The visualized portions of the valleculae and piriform sinuses are grossly unremarkable. The parotid and submandibular glands are within normal limits. No cervical lymphadenopathy is seen. CT CERVICAL SPINE FINDINGS Alignment: Normal. Skull base and vertebrae: No acute fracture. No primary bone lesion or focal pathologic process. Soft tissues and spinal canal: No prevertebral fluid or swelling. No visible canal hematoma. Disc levels: Intervertebral disc spaces are preserved. The bony foramina are grossly unremarkable. Upper chest: The thyroid gland is unremarkable in appearance. Mild calcification is noted at the  carotid bifurcations bilaterally. The visualized lung apices are clear. Other: No additional soft tissue abnormalities are seen. IMPRESSION: 1. No evidence of traumatic intracranial injury or fracture. 2. No evidence of fracture or subluxation along the cervical spine. 3. No evidence of fracture or dislocation with regard to the maxillofacial structures. 4. Mild calcification at the carotid bifurcations bilaterally. Electronically Signed   By: Roanna RaiderJeffery  Chang M.D.   On: 11/07/2017 04:32   Ct Maxillofacial Wo Contrast  Result Date: 11/07/2017 CLINICAL DATA:  Acute onset of syncope. Larey SeatFell forward and hit head on tile floor. Forehead and facial bruising. Vomiting. Concern for cervical spine injury. EXAM: CT HEAD WITHOUT CONTRAST CT MAXILLOFACIAL WITHOUT CONTRAST CT CERVICAL SPINE WITHOUT CONTRAST TECHNIQUE: Multidetector CT imaging of the head, cervical spine, and maxillofacial structures were performed using the standard protocol without intravenous contrast. Multiplanar CT image reconstructions of the cervical spine and maxillofacial structures were also generated. COMPARISON:  None. FINDINGS: CT HEAD FINDINGS Brain: No evidence of acute infarction, hemorrhage, hydrocephalus, extra-axial collection or mass lesion/mass effect. Mild cerebellar atrophy is noted. The brainstem and fourth ventricle are within normal limits. The third and lateral ventricles, and basal ganglia are unremarkable in appearance. The cerebral hemispheres are symmetric in appearance, with normal gray-white differentiation. No mass effect or midline shift is seen. Vascular: No hyperdense vessel or unexpected calcification. Skull: There is no evidence of fracture; visualized osseous structures are unremarkable in appearance. Other: No significant soft tissue abnormalities are seen. CT MAXILLOFACIAL FINDINGS Osseous: There is no evidence of fracture or dislocation. The maxilla and mandible appear intact. The nasal bone is unremarkable in  appearance. The visualized dentition demonstrates no acute abnormality. Orbits: The orbits are intact bilaterally. Sinuses: The visualized paranasal sinuses and mastoid air cells are well-aerated. Soft tissues: No significant soft tissue abnormalities are seen. The parapharyngeal fat planes are preserved. The nasopharynx, oropharynx and hypopharynx are unremarkable in appearance. The visualized portions of the valleculae and piriform sinuses are grossly unremarkable. The parotid and submandibular glands are within normal limits. No cervical lymphadenopathy is seen. CT CERVICAL SPINE FINDINGS Alignment: Normal. Skull base and vertebrae: No acute fracture. No primary bone lesion or focal pathologic process. Soft tissues and spinal canal: No prevertebral fluid or swelling. No visible canal hematoma. Disc levels: Intervertebral disc spaces are preserved. The bony foramina are grossly unremarkable. Upper chest: The thyroid gland is unremarkable in appearance. Mild calcification is noted at the carotid bifurcations bilaterally. The visualized lung apices are clear. Other: No additional soft tissue abnormalities are seen. IMPRESSION: 1. No evidence of traumatic intracranial injury or fracture. 2. No evidence of fracture or subluxation along the cervical spine. 3. No evidence of fracture or dislocation with regard to the maxillofacial structures. 4. Mild calcification at the carotid bifurcations bilaterally. Electronically Signed   By: Roanna RaiderJeffery  Chang M.D.   On: 11/07/2017 04:32    Procedures Procedures (including critical care time)  Medications Ordered in ED Medications - No data to display  Initial Impression / Assessment and Plan / ED Course  I have reviewed the triage vital signs and the nursing notes.  Pertinent labs & imaging results that were available during my care of the patient were reviewed by me and considered in my medical decision making (see chart for details).  52 year old female presenting  to the ED after syncopal event while getting her hair colored.  She fell forward out of the chair and struck the right side of her face against a tile floor.  Daughter who was there with her reports unconscious for about 60 seconds before coming to.  Did have one episode of emesis but no tremors or seizure activity.  Syncope was preceded by lightheadedness, sweating, flushing.  Does have history of syncope years ago without known cause.  Seen in urgent care and sent here as she was told she needed a CT scan.  Patient is awake, alert, appropriately oriented.  Does have some bruising to the right forehead and surrounding the right eye with orbital rim tenderness.  No gross deformity.  She is neurologically intact.  She does have murmur on exam, suspected aortic stenosis.  She has not had any chest pain, shortness of breath, or exertional symptoms.  States she was diagnosed with this several years ago but is never had it formally evaluated by cardiologist.  Given head trauma, will obtain CT head/neck/face.  Patient's imaging negative for any underlying injuries.  She remains at her neurologic baseline.  Hemodynamically stable without any chest pain, shortness of breath.  No further syncope.  Patient does have known murmur which could have caused her syncope, however given this was at rest with prodrome, could also very likely be vasovagal in nature. Nonetheless, she will need work-up for heart murmur.  Patient currently uninsured-- will refer to wellness clinic as well as cardiology for follow-up. She understands to return here for any new/acute changes.  Discussed with attending physician, Dr. Bebe Shaggy, who agrees with plan of care.  Final Clinical Impressions(s) / ED Diagnoses   Final diagnoses:  Syncope and collapse    ED Discharge Orders    None       Garlon Hatchet, PA-C 11/07/17 1610    Zadie Rhine, MD 11/07/17 2314

## 2017-11-07 NOTE — ED Notes (Signed)
Pt remains in waiting room. Updated on wait for treatment room. 

## 2017-11-25 ENCOUNTER — Other Ambulatory Visit: Payer: Self-pay

## 2017-11-25 ENCOUNTER — Ambulatory Visit: Payer: Self-pay | Admitting: Physician Assistant

## 2017-11-25 ENCOUNTER — Encounter: Payer: Self-pay | Admitting: Physician Assistant

## 2017-11-25 VITALS — BP 132/78 | HR 93 | Temp 99.0°F | Resp 16 | Ht 63.0 in | Wt 225.8 lb

## 2017-11-25 DIAGNOSIS — J019 Acute sinusitis, unspecified: Secondary | ICD-10-CM

## 2017-11-25 DIAGNOSIS — B9689 Other specified bacterial agents as the cause of diseases classified elsewhere: Secondary | ICD-10-CM

## 2017-11-25 DIAGNOSIS — I6523 Occlusion and stenosis of bilateral carotid arteries: Secondary | ICD-10-CM

## 2017-11-25 DIAGNOSIS — R011 Cardiac murmur, unspecified: Secondary | ICD-10-CM

## 2017-11-25 MED ORDER — PRAVASTATIN SODIUM 40 MG PO TABS: 40.0000 mg | ORAL_TABLET | Freq: Every day | ORAL | 3 refills | Status: AC

## 2017-11-25 MED ORDER — ASPIRIN EC 81 MG PO TBEC: 81.0000 mg | DELAYED_RELEASE_TABLET | Freq: Every day | ORAL | 3 refills | Status: AC

## 2017-11-25 MED ORDER — AMOXICILLIN-POT CLAVULANATE 875-125 MG PO TABS: 1.0000 | ORAL_TABLET | Freq: Two times a day (BID) | ORAL | 0 refills | Status: DC

## 2017-11-25 NOTE — Patient Instructions (Addendum)
  Stat the antibiotic.     Start the aspirin and pravastatin.  Go to good [employer] for coupons.    IF you received an x-ray today, you will receive an invoice from [facility]. Please contact [facility] at [phone] with questions or concerns regarding your invoice.   IF you received labwork today, you will receive an invoice from [facility]. Please contact [facility] at [phone] with questions or concerns regarding your invoice.   Our billing staff will not be able to assist you with questions regarding bills from these companies.  You will be contacted with the lab results as soon as they are available. The fastest way to get your results is to activate your My Chart account. Instructions are located on the last page of this paperwork. If you have not heard from us regarding the results in 2 weeks, please contact this office.

## 2017-11-25 NOTE — Progress Notes (Signed)
11/29/2017 9:37 AM   DOB: 10-07-1965 / MRN: [id number]  SUBJECTIVE:  Pam Mclaughlin is a 52 y.o. female presenting for nasal congestion, cough, and fatigue that started 3 weeks ago.  She associates new drainage from the left ear. Associates a change in hearing.   She was recently sent to the ED for a loss of consciousness and this was associated with a systolic murmur.  She denies chest pain and shortness of breath today.  She was referred to cardiology from the ED and tells me "I have been calling and have not heard anything back."  Unfortunately she is self-pay.  She reports that she is a never smoker and denies a history of diabetes.  Her lipid panel has never been run.  Her CT scan did show some "mild" plaques about the bilateral carotids.  She does not take a statin or aspirin at this time.    She is allergic to bee venom.   She  has a past medical history of Varicose veins.    She  reports that  has never smoked. she has never used smokeless tobacco. She reports that she does not drink alcohol or use drugs. She  reports that she does not engage in sexual activity. The patient  has a past surgical history that includes Cesarean section and Tonsillectomy.  Her family history is not on file. She was adopted.  Review of Systems  Constitutional: Negative for chills, diaphoresis and fever.  HENT: Positive for congestion and sinus pain.   Eyes: Negative.   Respiratory: Positive for cough. Negative for hemoptysis, sputum production, shortness of breath and wheezing.   Cardiovascular: Negative for chest pain, orthopnea and leg swelling.  Gastrointestinal: Negative for abdominal pain, blood in stool, constipation, diarrhea, heartburn, melena, nausea and vomiting.  Genitourinary: Negative for flank pain.  Skin: Negative for rash.  Neurological: Negative for dizziness, sensory change, speech change, focal weakness and headaches.    The problem list and medications were reviewed and updated by  myself where necessary and exist elsewhere in the encounter.   OBJECTIVE:  BP 132/78 (BP Location: Left Arm, Patient Position: Sitting, Cuff Size: Large)   Pulse 93   Temp 99 F (37.2 C) (Oral)   Resp 16   Ht 5\' 3"  (1.6 m)   Wt 225 lb 12.8 oz (102.4 kg)   SpO2 92%   BMI 40.00 kg/m   Wt Readings from Last 3 Encounters:  11/25/17 225 lb 12.8 oz (102.4 kg)  11/06/17 220 lb (99.8 kg)  09/28/17 236 lb (107 kg)     Physical Exam  Constitutional: She is oriented to person, place, and time. She is active.  Non-toxic appearance.  HENT:  Right Ear: Hearing, tympanic membrane, external ear and ear canal normal.  Left Ear: Hearing, tympanic membrane, external ear and ear canal normal.  Nose: Nose normal. Right sinus exhibits no maxillary sinus tenderness and no frontal sinus tenderness. Left sinus exhibits no maxillary sinus tenderness and no frontal sinus tenderness.  Mouth/Throat: Uvula is midline, oropharynx is clear and moist and mucous membranes are normal. Mucous membranes are not dry. No oropharyngeal exudate, posterior oropharyngeal edema or tonsillar abscesses.  Eyes: EOM are normal. Pupils are equal, round, and reactive to light.  Cardiovascular: Normal rate, regular rhythm, S1 normal, S2 normal, normal heart sounds and intact distal pulses. Exam reveals no gallop, no friction rub and no decreased pulses.  No murmur heard. Pulmonary/Chest: Effort normal. No stridor. No tachypnea. No respiratory distress.  She has no wheezes. She has no rales.  Abdominal: She exhibits no distension.  Musculoskeletal: She exhibits no edema.  Lymphadenopathy:       Head (right side): No submandibular and no tonsillar adenopathy present.       Head (left side): No submandibular and no tonsillar adenopathy present.    She has no cervical adenopathy.  Neurological: She is alert and oriented to person, place, and time. She has normal strength and normal reflexes. She is not disoriented. She displays no  atrophy. No cranial nerve deficit or sensory deficit. She exhibits normal muscle tone. Coordination and gait normal.  Skin: Skin is warm and dry. She is not diaphoretic. No pallor.  Psychiatric: Her behavior is normal.    No results found for this or any previous visit (from the past 72 hour(s)).  No results found.  ASSESSMENT AND PLAN:  Doristine was seen today for ear pain and cough.  Diagnoses and all orders for this visit:  Heart murmur: She is a self-pay patient.  I think is best that she see cardiology at this point given she has a 2 out of 6 to 3 out of 6 systolic ejection murmur.  EKG does show some signs of LVH.  Particularly in aVF.  She has some "mild" plaques about the bilateral carotids.  She is already struggling with the ED cost given the multiple scans for the LOC.  I think it is fairly certain that she needs to be on a statin and aspirin at this point.  I will bring her back in after cardiology referral for any further labs that she may need. -     Ambulatory referral to Cardiology  Acute bacterial rhinosinusitis -     amoxicillin-clavulanate (AUGMENTIN) 875-125 MG tablet; Take 1 tablet by mouth 2 (two) times daily.  Carotid artery plaque, bilateral -     aspirin EC 81 MG tablet; Take 1 tablet (81 mg total) by mouth daily. -     pravastatin (PRAVACHOL) 40 MG tablet; Take 1 tablet (40 mg total) by mouth daily.    The patient is advised to call or return to clinic if she does not see an improvement in symptoms, or to seek the care of the closest emergency department if she worsens with the above plan.   Deliah Boston, MHS, PA-C Primary Care at [facility] 11/29/2017 9:37 AM

## 2017-12-23 ENCOUNTER — Other Ambulatory Visit: Payer: Self-pay

## 2017-12-23 ENCOUNTER — Ambulatory Visit: Payer: Self-pay | Admitting: Physician Assistant

## 2017-12-23 ENCOUNTER — Encounter: Payer: Self-pay | Admitting: Physician Assistant

## 2017-12-23 VITALS — BP 154/89 | HR 70 | Temp 98.4°F | Resp 16 | Ht 63.5 in | Wt 235.8 lb

## 2017-12-23 DIAGNOSIS — I6523 Occlusion and stenosis of bilateral carotid arteries: Secondary | ICD-10-CM

## 2017-12-23 DIAGNOSIS — I10 Essential (primary) hypertension: Secondary | ICD-10-CM

## 2017-12-23 LAB — POCT CBC
Granulocyte percent: 64.1 %G (ref 37–80)
HEMATOCRIT: 40.9 % (ref 37.7–47.9)
Hemoglobin: 13.2 g/dL (ref 12.2–16.2)
LYMPH, POC: 2.5 (ref 0.6–3.4)
MCH, POC: 29 pg (ref 27–31.2)
MCHC: 32.2 g/dL (ref 31.8–35.4)
MCV: 89.9 fL (ref 80–97)
MID (CBC): 0.6 (ref 0–0.9)
MPV: 6.5 fL (ref 0–99.8)
POC GRANULOCYTE: 5.4 (ref 2–6.9)
POC LYMPH PERCENT: 29 %L (ref 10–50)
POC MID %: 6.9 % (ref 0–12)
Platelet Count, POC: 337 10*3/uL (ref 142–424)
RBC: 4.55 M/uL (ref 4.04–5.48)
RDW, POC: 13.2 %
WBC: 8.5 10*3/uL (ref 4.6–10.2)

## 2017-12-23 MED ORDER — LISINOPRIL-HYDROCHLOROTHIAZIDE 10-12.5 MG PO TABS: 1.0000 | ORAL_TABLET | Freq: Every day | ORAL | 3 refills | Status: AC

## 2017-12-23 NOTE — Progress Notes (Signed)
12/23/2017 2:38 PM   DOB: 24-Nov-1965 / MRN: [id number]  SUBJECTIVE:  Pam Mclaughlin is a 52 y.o. female presenting for recheck.  She has not been compliant with statin and aspirin therapy despite my advice.  Fortunately she has got an appointment with Dr. Herbie BaltimoreHarding just coming up in April.  Most recent EKG does show signs of LVH.  She has a known 2 out of 6 to 3 out of 6 systolic murmur best heard at the right sternal border.  She tells me that overall her ear feels much better.  She completed  10 days of Augmentin.  Reports some mild popping in the right ear today.  [facility] 12/23/2017  Decreased Interest 0  Down, Depressed, Hopeless 0  PHQ - 2 Score 0   She is allergic to bee venom.   She  has a past medical history of Varicose veins.    She  reports that she has never smoked. She has never used smokeless tobacco. She reports that she does not drink alcohol or use drugs. She  reports that she does not engage in sexual activity. The patient  has a past surgical history that includes Cesarean section and Tonsillectomy.  Her family history is not on file. She was adopted.  Review of Systems  Constitutional: Negative for chills, diaphoresis and fever.  Eyes: Negative.   Respiratory: Negative for cough, hemoptysis, sputum production, shortness of breath and wheezing.   Cardiovascular: Negative for chest pain, orthopnea and leg swelling.  Gastrointestinal: Negative for nausea.  Skin: Negative for rash.  Neurological: Negative for dizziness, sensory change, speech change, focal weakness and headaches.    The problem list and medications were reviewed and updated by myself where necessary and exist elsewhere in the encounter.   OBJECTIVE:  BP (!) 154/89   Pulse 70   Temp 98.4 F (36.9 C) (Oral)   Resp 16   Ht 5' 3.5" (1.613 m)   Wt 235 lb 12.8 oz (107 kg)   SpO2 96%   BMI 41.11 kg/m   BP Readings from Last 3 Encounters:  12/23/17 (!) 154/89  11/25/17 132/78    11/07/17 135/66     Physical Exam  Constitutional: She is active.  Non-toxic appearance.  HENT:  Right Ear: Hearing, tympanic membrane, external ear and ear canal normal.  Left Ear: Hearing, tympanic membrane, external ear and ear canal normal.  Nose: Nose normal. Right sinus exhibits no maxillary sinus tenderness and no frontal sinus tenderness. Left sinus exhibits no maxillary sinus tenderness and no frontal sinus tenderness.  Mouth/Throat: Uvula is midline, oropharynx is clear and moist and mucous membranes are normal. Mucous membranes are not dry. No oropharyngeal exudate, posterior oropharyngeal edema or tonsillar abscesses.  Cardiovascular: Normal rate, regular rhythm, S1 normal, S2 normal and intact distal pulses. Exam reveals no gallop, no friction rub and no decreased pulses.  Murmur (2-3/6 systolic mumur best heard at the right sternal border) heard. Pulmonary/Chest: Effort normal. No stridor. No tachypnea. No respiratory distress. She has no wheezes. She has no rales.  Abdominal: She exhibits no distension.  Musculoskeletal: She exhibits no edema.  Lymphadenopathy:       Head (right side): No submandibular and no tonsillar adenopathy present.       Head (left side): No submandibular and no tonsillar adenopathy present.    She has no cervical adenopathy.  Neurological: She is alert.  Skin: Skin is warm and dry. She is not diaphoretic. No pallor.  Lab Results  Component Value Date   HGBA1C 5.9 09/28/2017   Lab Results  Component Value Date   CREATININE 0.65 11/06/2017   BUN 12 11/06/2017   NA 137 11/06/2017   K 4.1 11/06/2017   CL 101 11/06/2017   CO2 24 11/06/2017   Lab Results  Component Value Date   WBC 8.5 12/23/2017   HGB 13.2 12/23/2017   HCT 40.9 12/23/2017   MCV 89.9 12/23/2017   PLT 333 11/06/2017      Results for orders placed or performed in visit on 12/23/17 (from the past 72 hour(s))  POCT CBC     Status: None   Collection Time: 12/23/17   2:14 PM  Result Value Ref Range   WBC 8.5 4.6 - 10.2 K/uL   Lymph, poc 2.5 0.6 - 3.4   POC LYMPH PERCENT 29.0 10 - 50 %L   MID (cbc) 0.6 0 - 0.9   POC MID % 6.9 0 - 12 %M   POC Granulocyte 5.4 2 - 6.9   Granulocyte percent 64.1 37 - 80 %G   RBC 4.55 4.04 - 5.48 M/uL   Hemoglobin 13.2 12.2 - 16.2 g/dL   HCT, POC 16.1 09.6 - 47.9 %   MCV 89.9 80 - 97 fL   MCH, POC 29.0 27 - 31.2 pg   MCHC 32.2 31.8 - 35.4 g/dL   RDW, POC 04.5 %   Platelet Count, POC 337 142 - 424 K/uL   MPV 6.5 0 - 99.8 fL    No results found.  ASSESSMENT AND PLAN:  Pam Mclaughlin was seen today for heart murmur and ears.  Diagnoses and all orders for this visit:  Atherosclerosis of both carotid arteries she is noncompliant with her current medications.  I tried to encourage her as best I can to take her meds daily.  It is unclear why she is noncompliant.   I am glad she is scheduled to see Dr. Herbie Baltimore.  She has a history of presyncope with a known systolic murmur and more recently atherosclerosis found on her bilateral carotids. -     Lipid Panel -     lisinopril-hydrochlorothiazide (PRINZIDE,ZESTORETIC) 10-12.5 MG tablet; Take 1 tablet by mouth daily. -     POCT CBC  Uncontrolled hypertension -     lisinopril-hydrochlorothiazide (PRINZIDE,ZESTORETIC) 10-12.5 MG tablet; Take 1 tablet by mouth daily. -     POCT CBC    The patient is advised to call or return to clinic if she does not see an improvement in symptoms, or to seek the care of the closest emergency department if she worsens with the above plan.   Deliah Boston, MHS, PA-C [facility] at [facility] 12/23/2017 2:38 PM

## 2017-12-23 NOTE — Patient Instructions (Addendum)
  Try zyrtec. Please, please take you prescribed meds daily.    IF you received an x-ray today, you will receive an invoice from [facility]. Please contact [facility] at [phone] with questions or concerns regarding your invoice.   IF you received labwork today, you will receive an invoice from [facility]. Please contact [facility] at [phone] with questions or concerns regarding your invoice.   Our billing staff will not be able to assist you with questions regarding bills from these companies.  You will be contacted with the lab results as soon as they are available. The fastest way to get your results is to activate your My Chart account. Instructions are located on the last page of this paperwork. If you have not heard from us regarding the results in 2 weeks, please contact this office.

## 2017-12-24 LAB — LIPID PANEL
CHOL/HDL RATIO: 3.7 ratio (ref 0.0–4.4)
Cholesterol, Total: 187 mg/dL (ref 100–199)
HDL: 51 mg/dL (ref >39)
LDL CALC: 101 mg/dL — AB (ref 0–99)
TRIGLYCERIDES: 177 mg/dL — AB (ref 0–149)
VLDL CHOLESTEROL CAL: 35 mg/dL (ref 5–40)

## 2017-12-30 ENCOUNTER — Encounter: Payer: Self-pay | Admitting: Cardiology

## 2017-12-30 ENCOUNTER — Ambulatory Visit (INDEPENDENT_AMBULATORY_CARE_PROVIDER_SITE_OTHER): Payer: Self-pay | Admitting: Cardiology

## 2017-12-30 VITALS — BP 138/74 | HR 82 | Ht 63.5 in | Wt 227.2 lb

## 2017-12-30 DIAGNOSIS — R55 Syncope and collapse: Secondary | ICD-10-CM

## 2017-12-30 DIAGNOSIS — I83893 Varicose veins of bilateral lower extremities with other complications: Secondary | ICD-10-CM

## 2017-12-30 DIAGNOSIS — R9431 Abnormal electrocardiogram [ECG] [EKG]: Secondary | ICD-10-CM

## 2017-12-30 DIAGNOSIS — R011 Cardiac murmur, unspecified: Secondary | ICD-10-CM

## 2017-12-30 NOTE — Patient Instructions (Addendum)
Medication Instructions:  No medication changes    Testing/Procedures: Scheduled at [address] Your physician has requested that you have an echocardiogram. Echocardiography is a painless test that uses sound waves to create images of your heart. It provides your doctor with information about the size and shape of your heart and how well your heart's chambers and valves are working. This procedure takes approximately one hour. There are no restrictions for this procedure.    Follow-Up: Your physician recommends that you schedule a follow-up appointment in: 1 month after ECHO with Dr. Herbie BaltimoreHarding   Any Other Special Instructions Will Be Listed Below (If Applicable).   DR Herbie BaltimoreHARDING RECOMMENDS THAT YOU  PURCHASES  " Kardia" By [employer] FROM THE  GOOGLE[employer]  APP PLAY STORE.   THE APP IS FREE , BUT THE  EQUIPMENT HAS A COST. IT IS A WAY FOR YOU TO OBTAIN A RECORDING OF YOUR HEART RATE . IT WILL BE A SHORT RHYTHM  STRIP THAT YOU CAN SHOW TO MEDICAL STAFF.    If you need a refill on your cardiac medications before your next appointment, please call your pharmacy.

## 2017-12-30 NOTE — Progress Notes (Signed)
PCP: Patient, No Pcp Per   Clinic Note: Chief Complaint  Patient presents with  . New Patient (Initial Visit)    ER-PCP  . Loss of Consciousness  . Heart Murmur    HPI: Pam Mclaughlin is a 52 y.o. female with a PMH below who presents today for the evaluation of Heart Murmur, Abnromal EKG & recent episode of syncope at the request of Ofilia NeasClark, Michael L, PA-C.  Pam Mclaughlin was seen on so that see Dr. Chestine Sporelark   Recent Hospitalizations: ER visit November 07, 2017 for syncope and collapse Was seen after being seen in urgent care center after an episode of feeling dizzy and falling backwards.  Making a snoring sound and vomiting yellow liquid.  This while having her hair styled (by her daughter) while she was having "foils placed in here " -> reportedly was unconscious for roughly 1 minute.  Studies Personally Reviewed - (if available, images/films reviewed: From Epic Chart or Care Everywhere)  n/a  Interval History: Pam Mclaughlin presents here today to discuss findings of heart murmur and her recent syncopal episode.  She describes the episode as a very unusual finding for her.  She was simply sitting in a chair with her daughter working on her hair, and she started to feel warm and flushed and lightheaded and felt like she was in a pass out.  Her daughter initially thought she was okay, but she clearly was not and she continued to fall backwards.  Apparently there was a gurgling or snoring sound when she fell down and then she threw up.  There was no real seizure-like activity noted and she was not postictal following this.  She felt quite sleepy and tired afterwards.  She denied any sensation of any irregular heartbeats or palpitations.  She denied chest tightness or pressure associated with it.  She has not had any episodes like this before [place] any since.  This episode occurred in February.  Since then though she has had some off and on episodes of strange sensation in her chest. In addition to denying  palpitation symptoms associated with the syncopal episode, she denied any sensation of unilateral weakness or her slurred speech etc.  She describes having occasional episodes of "feeling like my heart is not beating 'right'" lasting ~2-5 min off & on.  May occur once in day & then not again for 1-2 weeks.  Interestingly, however this symptom was not noted at the time of her syncopal episode.  No chest pain or shortness of breath with rest or exertion. No PND, orthopnea or edema.  No TIA/amaurosis fugax symptoms.   ROS: A comprehensive was performed. Review of Systems  Constitutional: Negative for chills, fever and malaise/fatigue (No further episodes of fatigue since right after the syncopal episode).  HENT: Positive for congestion, ear discharge and ear pain. Negative for nosebleeds.        Sinus infection.  Respiratory: Negative for cough, shortness of breath and wheezing.   Cardiovascular: Negative for claudication.       Per HPI  Gastrointestinal: Negative for abdominal pain, constipation and heartburn.  Genitourinary: Negative for hematuria.  Musculoskeletal: Positive for falls (Only when she passed out). Negative for joint pain.  Neurological: Positive for dizziness (May be associated with the unusual sensation in her chest.) and loss of consciousness (Per HPI). Negative for tingling, focal weakness, weakness and headaches.       No further syncope or near syncope  Psychiatric/Behavioral: Negative for memory loss. The patient  is not nervous/anxious and does not have insomnia.   All other systems reviewed and are negative.   I have reviewed and (if needed) personally updated the patient's problem list, medications, allergies, past medical and surgical history, social and family history.   Past Medical History:  Diagnosis Date  . Varicose veins    had stripping & sclerotherapy    Past Surgical History:  Procedure Laterality Date  . CESAREAN SECTION    . TONSILLECTOMY       Current Meds  Medication Sig  . aspirin EC 81 MG tablet Take 1 tablet (81 mg total) by mouth daily.  Marland Kitchen lisinopril-hydrochlorothiazide (PRINZIDE,ZESTORETIC) 10-12.5 MG tablet Take 1 tablet by mouth daily.  . pravastatin (PRAVACHOL) 40 MG tablet Take 1 tablet (40 mg total) by mouth daily.    Allergies  Allergen Reactions  . Bee Venom Anaphylaxis    Social History   Tobacco Use  . Smoking status: Never Smoker  . Smokeless tobacco: Never Used  Substance Use Topics  . Alcohol use: No    Alcohol/week: 12.6 oz    Types: 21 Cans of beer per week  . Drug use: No   Social History   Social History Narrative  . Not on file    She was adopted. Family history is unknown by patient.  Wt Readings from Last 3 Encounters:  12/30/17 227 lb 3.2 oz (103.1 kg)  12/23/17 235 lb 12.8 oz (107 kg)  11/25/17 225 lb 12.8 oz (102.4 kg)    PHYSICAL EXAM BP 138/74 (BP Location: Right Arm)   Pulse 82   Ht 5' 3.5" (1.613 m)   Wt 227 lb 3.2 oz (103.1 kg)   BMI 39.62 kg/m  Physical Exam  Constitutional: She is oriented to person, place, and time. She appears well-developed and well-nourished. No distress.  Healthy-appearing.  Borderline morbidly obese.  HENT:  Head: Normocephalic and atraumatic.  Mouth/Throat: No oropharyngeal exudate.  Eyes: Pupils are equal, round, and reactive to light. Conjunctivae and EOM are normal. No scleral icterus.  Neck: No hepatojugular reflux and no JVD present. Carotid bruit is not present.  Cardiovascular: Normal rate, regular rhythm and intact distal pulses.  Occasional extrasystoles are present. PMI is not displaced. Exam reveals no gallop and no friction rub.  Murmur heard.  Medium-pitched harsh crescendo-decrescendo midsystolic murmur is present with a grade of 3/6 at the upper right sternal border radiating to the neck. Normal carotid upstroke Pulmonary/Chest: Effort normal and breath sounds normal. No respiratory distress. She has no wheezes. She has no  rales.  Abdominal: Soft. Bowel sounds are normal. She exhibits no distension. There is no tenderness. There is no rebound.  Mild truncal obesity.  No HSM  Musculoskeletal: Normal range of motion. She exhibits no edema.  Neurological: She is alert and oriented to person, place, and time. No cranial nerve deficit.  Skin: Skin is warm.  Psychiatric: She has a normal mood and affect. Her behavior is normal. Judgment and thought content normal.  Nursing note and vitals reviewed.   Adult ECG Report  Rate: 82;  Rhythm: normal sinus rhythm and LVH with repolarization abnormality.  Marketed ST abnormalities either lateral endocardial injury versus repolarization changes.; normal axis and intervals.  Narrative Interpretation: Stable EKG from ER visit   Other studies Reviewed: Additional studies/ records that were reviewed today include:  Recent Labs:   Lab Results  Component Value Date   CHOL 187 12/23/2017   HDL 51 12/23/2017   LDLCALC 101 (H) 12/23/2017  TRIG 177 (H) 12/23/2017   CHOLHDL 3.7 12/23/2017   Lab Results  Component Value Date   CREATININE 0.65 11/06/2017   BUN 12 11/06/2017   NA 137 11/06/2017   K 4.1 11/06/2017   CL 101 11/06/2017   CO2 24 11/06/2017   Lab Results  Component Value Date   WBC 8.5 12/23/2017   HGB 13.2 12/23/2017   HCT 40.9 12/23/2017   MCV 89.9 12/23/2017   PLT 333 11/06/2017     ASSESSMENT / PLAN: Problem List Items Addressed This Visit    Syncope and collapse    Very hard to figure out what happened.  It is hard to say if this may not have been a TIA episode.  Unfortunately, reformed enough out from the episode that it may be too late to do anything further.  Could consider head CT at least if not already done. With systolic ejection murmur, need to exclude significant aortic stenosis.  Plan: 2D echo. Relatively infrequent episodes of irregular heartbeats.  She would prefer not to wear monitor.  We talked about purchasing smart phone  application: " Kardia" By [employer].   this would require purchasing the monitoring strip with a free application.  At this point, will see the results of the echocardiogram.  She has no carotid bruits.  We may never actually truly figure out the true etiology, cannot exclude vasovagal though if she was turning her head a certain way for her to be done.      Relevant Orders   EKG 12-Lead (Completed)   ECHOCARDIOGRAM COMPLETE   Symptomatic varicose veins of both lower extremities    History of vein stripping, she says the edema is well controlled.  She is not having any orthostatic symptoms, so I doubt this had much to do with her syncopal episode. We discussed importance of foot elevation and support stockings if swelling does worsen.  Would prefer to avoid diuretic.      Heart murmur - Primary    She deftly has an aortic systolic ejection murmur.  Difficult to assess how severe it is, but given her age, I would be concerned for possible bicuspid aortic valve.  The question would be is is is severe enough to cause syncope. Plan: Check 2D echo.      Relevant Orders   EKG 12-Lead (Completed)   ECHOCARDIOGRAM COMPLETE   EKG, abnormal    Noted LVH on EKG with significant ST depressions and abnormalities that could potentially be ischemic.  She is not having any symptoms.  This is probably repolarization changes from LVH. Plan: Check 2D echo.      Relevant Orders   EKG 12-Lead (Completed)   ECHOCARDIOGRAM COMPLETE      I spent a total of 45 minutes with the patient and chart review. >  50% of the time was spent in direct patient consultation.   Current medicines are reviewed at length with the patient today.  (+/- concerns) - acknowledges need to start BP meds & statin The following changes have been made:  n/a  Patient Instructions  Medication Instructions:  No medication changes    Testing/Procedures: Scheduled at [address] Your physician has  requested that you have an echocardiogram. Echocardiography is a painless test that uses sound waves to create images of your heart. It provides your doctor with information about the size and shape of your heart and how well your heart's chambers and valves are working. This procedure takes  approximately one hour. There are no restrictions for this procedure.    Follow-Up: Your physician recommends that you schedule a follow-up appointment in: 1 month after ECHO with Dr. Herbie Baltimore   Any Other Special Instructions Will Be Listed Below (If Applicable).   DR Herbie Baltimore RECOMMENDS THAT YOU  PURCHASES  " Kardia" By [employer]. FROM THE  [employer]  APP PLAY STORE.   THE APP IS FREE , BUT THE  EQUIPMENT HAS A COST. IT IS A WAY FOR YOU TO OBTAIN A RECORDING OF YOUR HEART RATE . IT WILL BE A SHORT RHYTHM  STRIP THAT YOU CAN SHOW TO MEDICAL STAFF.      If you need a refill on your cardiac medications before your next appointment, please call your pharmacy.      Studies Ordered:   Orders Placed This Encounter  Procedures  . EKG 12-Lead  . ECHOCARDIOGRAM COMPLETE      Bryan Lemma, M.D., M.S. Interventional Cardiologist   Pager # [phone] Phone # [phone] [address] [place], [place] [address]   Thank you for choosing [facility] at [facility]!!

## 2018-01-01 ENCOUNTER — Encounter: Payer: Self-pay | Admitting: Cardiology

## 2018-01-01 DIAGNOSIS — R9431 Abnormal electrocardiogram [ECG] [EKG]: Secondary | ICD-10-CM | POA: Insufficient documentation

## 2018-01-01 NOTE — Assessment & Plan Note (Addendum)
Very hard to figure out what happened.  It is hard to say if this may not have been a TIA episode.  Unfortunately, reformed enough out from the episode that it may be too late to do anything further.  Could consider head CT at least if not already done. With systolic ejection murmur, need to exclude significant aortic stenosis.  Plan: 2D echo. Relatively infrequent episodes of irregular heartbeats.  She would prefer not to wear monitor.  We talked about purchasing smart phone application: " Kardia" By [employer].   this would require purchasing the monitoring strip with a free application.  At this point, will see the results of the echocardiogram.  She has no carotid bruits.  We may never actually truly figure out the true etiology, cannot exclude vasovagal though if she was turning her head a certain way for her to be done.

## 2018-01-01 NOTE — Assessment & Plan Note (Signed)
She deftly has an aortic systolic ejection murmur.  Difficult to assess how severe it is, but given her age, I would be concerned for possible bicuspid aortic valve.  The question would be is is is severe enough to cause syncope. Plan: Check 2D echo.

## 2018-01-01 NOTE — Assessment & Plan Note (Signed)
History of vein stripping, she says the edema is well controlled.  She is not having any orthostatic symptoms, so I doubt this had much to do with her syncopal episode. We discussed importance of foot elevation and support stockings if swelling does worsen.  Would prefer to avoid diuretic.

## 2018-01-01 NOTE — Assessment & Plan Note (Signed)
Noted LVH on EKG with significant ST depressions and abnormalities that could potentially be ischemic.  She is not having any symptoms.  This is probably repolarization changes from LVH. Plan: Check 2D echo.

## 2018-01-09 ENCOUNTER — Telehealth: Payer: Self-pay | Admitting: Cardiology

## 2018-01-09 NOTE — Telephone Encounter (Signed)
Spoke to [name] , will not be able to an answer patient has not had schedule echo . Not schedule until 01/15/18. Will be glad to send  Information once received.  Fax [phone]  Dr Reuel Boomaniel  (dentist)   Susie verbalized understanding.

## 2018-01-09 NOTE — Telephone Encounter (Signed)
New Message:     Pt is at the dentist right now sitting in the chair and due to her diagnosis of a heart murmur the nurse would like to know if the pt needs to take antibiotics before her appt.

## 2018-01-15 ENCOUNTER — Other Ambulatory Visit: Payer: Self-pay

## 2018-01-15 ENCOUNTER — Ambulatory Visit (HOSPITAL_COMMUNITY): Payer: Self-pay | Attending: Cardiology

## 2018-01-15 DIAGNOSIS — R55 Syncope and collapse: Secondary | ICD-10-CM | POA: Insufficient documentation

## 2018-01-15 DIAGNOSIS — I110 Hypertensive heart disease with heart failure: Secondary | ICD-10-CM | POA: Insufficient documentation

## 2018-01-15 DIAGNOSIS — I509 Heart failure, unspecified: Secondary | ICD-10-CM | POA: Insufficient documentation

## 2018-01-15 DIAGNOSIS — R9431 Abnormal electrocardiogram [ECG] [EKG]: Secondary | ICD-10-CM | POA: Insufficient documentation

## 2018-01-15 DIAGNOSIS — Z6839 Body mass index (BMI) 39.0-39.9, adult: Secondary | ICD-10-CM | POA: Insufficient documentation

## 2018-01-15 DIAGNOSIS — R079 Chest pain, unspecified: Secondary | ICD-10-CM | POA: Insufficient documentation

## 2018-01-15 DIAGNOSIS — I270 Primary pulmonary hypertension: Secondary | ICD-10-CM | POA: Insufficient documentation

## 2018-01-15 DIAGNOSIS — E785 Hyperlipidemia, unspecified: Secondary | ICD-10-CM | POA: Insufficient documentation

## 2018-01-15 DIAGNOSIS — R011 Cardiac murmur, unspecified: Secondary | ICD-10-CM | POA: Insufficient documentation

## 2018-01-15 HISTORY — PX: TRANSTHORACIC ECHOCARDIOGRAM: SHX275

## 2018-01-27 NOTE — Telephone Encounter (Signed)
Correction.  Okay to proceed without need for prophylactic antibiotics.  Bryan Lemma, MD

## 2018-01-27 NOTE — Telephone Encounter (Signed)
No significant valve lesions.  Okay to proceed with prophylactic antibiotics.  Bryan Lemma, MD

## 2018-01-31 ENCOUNTER — Telehealth: Payer: Self-pay | Admitting: *Deleted

## 2018-01-31 NOTE — Telephone Encounter (Signed)
Spoke to patient. Result given . Verbalized understanding Aware to keep appt on 02/26/18  Patient states that she has some recording of heart rate saved on phone. RN instruct patient to bring to appointment for review with doctor

## 2018-01-31 NOTE — Telephone Encounter (Signed)
-----   Message from Marykay Lex, MD sent at 01/16/2018  1:27 AM EDT ----- Echo results:  Normal EF - 60-65%.  Moderate LVH with Grade 2 diastolic dysfunction --> usually from HTN, stiff LV does not fill well, can lead to exertional dyspnea, (& if BP goes too high, can have CP) Can discuss details in f/u.  Bryan Lemma, MD

## 2018-01-31 NOTE — Telephone Encounter (Signed)
LEFT MESSAGE TO CALL BACK

## 2018-02-26 ENCOUNTER — Ambulatory Visit: Payer: BLUE CROSS/BLUE SHIELD | Admitting: Cardiology

## 2018-02-26 ENCOUNTER — Encounter: Payer: Self-pay | Admitting: Cardiology

## 2018-02-26 DIAGNOSIS — R011 Cardiac murmur, unspecified: Secondary | ICD-10-CM | POA: Diagnosis not present

## 2018-02-26 DIAGNOSIS — R55 Syncope and collapse: Secondary | ICD-10-CM | POA: Diagnosis not present

## 2018-02-26 DIAGNOSIS — I83892 Varicose veins of left lower extremities with other complications: Secondary | ICD-10-CM | POA: Diagnosis not present

## 2018-02-26 NOTE — Progress Notes (Signed)
PCP: Patient, No Pcp Per -Deliah Boston, PA   Clinic Note: Chief Complaint  Patient presents with  . Follow-up    Echocardiogram results  . Loss of Consciousness    No further events    HPI: Pam Mclaughlin is a 52 y.o. female with a PMH below who presents today for the follow-up evaluation of Heart Murmur, Abnromal EKG & recent episode of syncope at the request of Deliah Boston, PA.  Pam Mclaughlin  was seen in initial consultation on December 30, 2017 to discuss her recent syncopal episode as well as heart murmur.  She was sitting in a chair while her daughter was working on her hair and she began to feel flushed/lightheaded and passed out.  This is never happened to her before.  She went to the emergency room on February 7 as a result.  Recent Hospitalizations: No new visits  Studies Personally Reviewed - (if available, images/films reviewed: From Epic Chart or Care Everywhere)  Echocardiogram January 15, 2018: Moderate LVH with GRII DD.  EF 60 to 65%.  No regional wall motion normalities.  No obvious valvular lesions.   She actually did purchase the [employer] APP --she had several triggered events, all of which appeared to be sinus rhythm with no obvious arrhythmias.  There was one sinus tachycardia spell that she felt was the most notable (heart rate was in the 1 teens as opposed to >  140s)   Interval History: Pam Mclaughlin presents here today to discuss findings of heart murmur and her recent syncopal episode.  She has not had any further episodes of syncope.  She just has been somewhat concerned and worried about the episode that is not anything like it since.  She does occasionally feel forceful irregular beats, but apparently has not captured a bad episode. Otherwise, she denies any chest tightness or pressure at rest or exertion.  No PND orthopnea or edema.  No recurrent syncope or near syncope.  No TIA or amaurosis fugax.  No claudication. No documented tachycardia or  bradycardia.   ROS: A comprehensive was performed. Review of Systems  Constitutional: Negative for chills, fever and malaise/fatigue (No further episodes of fatigue since right after the syncopal episode).  HENT: Positive for congestion.        Sinus infection.  Cardiovascular:       Per HPI  Gastrointestinal: Negative for abdominal pain, constipation and heartburn.  Genitourinary: Negative for hematuria.  Musculoskeletal: Negative for falls (Only when she passed out).  Neurological: Negative for dizziness (May be associated with the unusual sensation in her chest.), tingling, focal weakness, loss of consciousness (Per HPI), weakness and headaches.       No further syncope or near syncope  All other systems reviewed and are negative.   I have reviewed and (if needed) personally updated the patient's problem list, medications, allergies, past medical and surgical history, social and family history.   Past Medical History:  Diagnosis Date  . Varicose veins    had stripping & sclerotherapy    Past Surgical History:  Procedure Laterality Date  . CESAREAN SECTION    . TONSILLECTOMY    . TRANSTHORACIC ECHOCARDIOGRAM  01/15/2018   Moderate LVH with GRII DD.  EF 60 to 65%.  No regional wall motion normalities.  No obvious valvular lesions.     Current Meds  Medication Sig  . aspirin EC 81 MG tablet Take 1 tablet (81 mg total) by mouth daily.  Marland Kitchen lisinopril-hydrochlorothiazide (PRINZIDE,ZESTORETIC)  10-12.5 MG tablet Take 1 tablet by mouth daily.  . pravastatin (PRAVACHOL) 40 MG tablet Take 1 tablet (40 mg total) by mouth daily.    Allergies  Allergen Reactions  . Bee Venom Anaphylaxis    Social History   Tobacco Use  . Smoking status: Never Smoker  . Smokeless tobacco: Never Used  Substance Use Topics  . Alcohol use: No    Alcohol/week: 12.6 oz    Types: 21 Cans of beer per week  . Drug use: No   Social History   Social History Narrative  . Not on file    She was  adopted. Family history is unknown by patient.  Wt Readings from Last 3 Encounters:  02/26/18 237 lb 12.8 oz (107.9 kg)  12/30/17 227 lb 3.2 oz (103.1 kg)  12/23/17 235 lb 12.8 oz (107 kg)    PHYSICAL EXAM BP (!) 144/86   Pulse 72   Ht  (1.6 m)   Wt 237 lb 12.8 oz (107.9 kg)   SpO2 97%   BMI 42.12 kg/m  Physical Exam  Constitutional: She is oriented to person, place, and time. She appears well-developed and well-nourished. No distress.  Healthy-appearing.  Borderline morbidly obese.  HENT:  Head: Normocephalic and atraumatic.  Neck: No hepatojugular reflux and no JVD present. Carotid bruit is not present.  Cardiovascular: Normal rate, regular rhythm and intact distal pulses.  No extrasystoles are present. PMI is not displaced. Exam reveals no gallop and no friction rub.  Murmur heard.  Medium-pitched harsh crescendo-decrescendo early systolic murmur is present with a grade of 2/6 at the upper right sternal border radiating to the neck. Normal carotid upstroke; Murmur is definitely less prominent Pulmonary/Chest: Effort normal and breath sounds normal. No respiratory distress. She has no wheezes. She has no rales.  Abdominal: Soft. Bowel sounds are normal. She exhibits no distension. There is no tenderness. There is no rebound.  Mild truncal obesity.  No HSM  Musculoskeletal: Normal range of motion. She exhibits no edema.  Neurological: She is alert and oriented to person, place, and time.  Psychiatric: She has a normal mood and affect. Her behavior is normal. Judgment and thought content normal.  Nursing note and vitals reviewed.   Adult ECG Report Not recorded  Other studies Reviewed: Additional studies/ records that were reviewed today include:  Recent Labs:   Lab Results  Component Value Date   CHOL 187 12/23/2017   HDL 51 12/23/2017   LDLCALC 101 (H) 12/23/2017   TRIG 177 (H) 12/23/2017   CHOLHDL 3.7 12/23/2017   Lab Results  Component Value Date    CREATININE 0.65 11/06/2017   BUN 12 11/06/2017   NA 137 11/06/2017   K 4.1 11/06/2017   CL 101 11/06/2017   CO2 24 11/06/2017   Lab Results  Component Value Date   WBC 8.5 12/23/2017   HGB 13.2 12/23/2017   HCT 40.9 12/23/2017   MCV 89.9 12/23/2017   PLT 333 11/06/2017     ASSESSMENT / PLAN: Problem List Items Addressed This Visit    Varicose veins of left lower extremity with complications    If swelling worsens or becomes more uncomfortable, would recommend support stockings.      Syncope and collapse    No further episodes.  Normal echocardiogram with no structural of normalities.  This is despite hearing a murmur on exam which is probably a flow murmur).  Continue to use the app [employer] to evaluate for any potential arrhythmias.  Most likely etiology for syncope was  Vasovagal. Continue to stay adequately hydrated and avoid situations of overheating.  If there is evidence of tachycardia episode, may consider adding beta-blocker/calcium channel blocker for rate control.      Heart murmur    Aortic ejection murmur is heard on exam, but echocardiogram did not show any valvular lesions.  I suspect she may have some aortic sclerosis and outflow tract related cause versus simply being an innocent flow murmur.          Current medicines are reviewed at length with the patient today.  (+/- concerns) - acknowledges need to start BP meds & statin The following changes have been made:  n/a  Patient Instructions  MEDICATION INSTRUCTIONS  NO CHANGES AT CURRENT TIME.  RECOMMEND YOU FOLLOW THESE INSTRUCTIONS IF HEART RATE IS IN THE 110's TO 120's  -most like not any arrythmia Take not deep breathes , drinks some water , try to relax.  keep track of episodes  And bring to next appointment.   If heart rate continue in the 140's to 150's and becomes for frequent - contact office for further instruction may start a new medication.  Stay hydrated , and continue to watch for  symptoms.   Your physician wants you to follow-up in OCT 2019 WITH DR HARDING.You will receive a reminder letter in the mail two months in advance. If you don't receive a letter, please call our office to schedule the follow-up appointment.     Studies Ordered:   No orders of the defined types were placed in this encounter.     Bryan Lemma, M.D., M.S. Interventional Cardiologist   Pager # [phone] Phone # [phone] [address] [place], [place] [address]   Thank you for choosing [facility]!!

## 2018-02-26 NOTE — Patient Instructions (Addendum)
MEDICATION INSTRUCTIONS  NO CHANGES AT CURRENT TIME.  RECOMMEND YOU FOLLOW THESE INSTRUCTIONS IF HEART RATE IS IN THE 110's TO 120's  -most like not any arrythmia Take not deep breathes , drinks some water , try to relax.  keep track of episodes  And bring to next appointment.   If heart rate continue in the 140's to 150's and becomes for frequent - contact office for further instruction may start a new medication.  Stay hydrated , and continue to watch for symptoms.   Your physician wants you to follow-up in OCT 2019 WITH DR HARDING.You will receive a reminder letter in the mail two months in advance. If you don't receive a letter, please call our office to schedule the follow-up appointment.

## 2018-03-01 ENCOUNTER — Encounter: Payer: Self-pay | Admitting: Cardiology

## 2018-03-01 NOTE — Assessment & Plan Note (Signed)
Aortic ejection murmur is heard on exam, but echocardiogram did not show any valvular lesions.  I suspect she may have some aortic sclerosis and outflow tract related cause versus simply being an innocent flow murmur.

## 2018-03-01 NOTE — Assessment & Plan Note (Signed)
If swelling worsens or becomes more uncomfortable, would recommend support stockings.

## 2018-03-01 NOTE — Assessment & Plan Note (Addendum)
No further episodes.  Normal echocardiogram with no structural of normalities.  This is despite hearing a murmur on exam which is probably a flow murmur).  Continue to use the app [employer] to evaluate for any potential arrhythmias.  Most likely etiology for syncope was  Vasovagal. Continue to stay adequately hydrated and avoid situations of overheating.  If there is evidence of tachycardia episode, may consider adding beta-blocker/calcium channel blocker for rate control.

## 2019-10-07 IMAGING — CT CT CERVICAL SPINE W/O CM
2 of 11 series · 6 of 33 positions shown, 7 images · non-contrast
Comparison: None.

CLINICAL DATA: Acute onset of syncope. Fell forward and hit head on
tile floor. Forehead and facial bruising. Vomiting. Concern for
cervical spine injury.

EXAM:
CT HEAD WITHOUT CONTRAST
CT MAXILLOFACIAL WITHOUT CONTRAST
CT CERVICAL SPINE WITHOUT CONTRAST
TECHNIQUE: Multidetector CT imaging of the head, cervical spine, and
maxillofacial structures were performed using the standard protocol
without intravenous contrast. Multiplanar CT image reconstructions
of the cervical spine and maxillofacial structures were also
generated.

[Series 8: facialbone 2.0 st · axial · 0.34mm/px · z∈[-201,-35]mm · 3 of 84 slices shown, 4 images]
[im 1/84  soft-tissue]
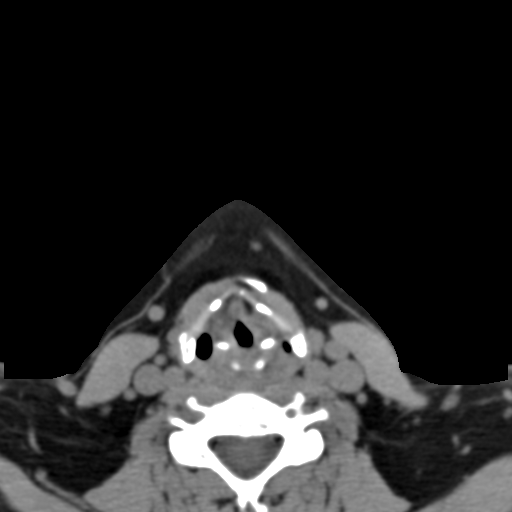
[im 1/84  bone]
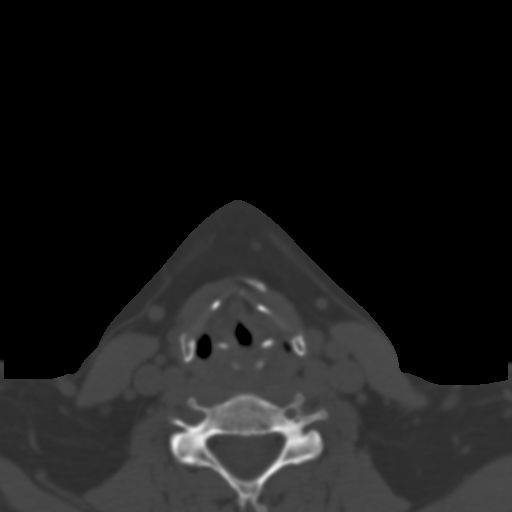
[im 42/84  bone]
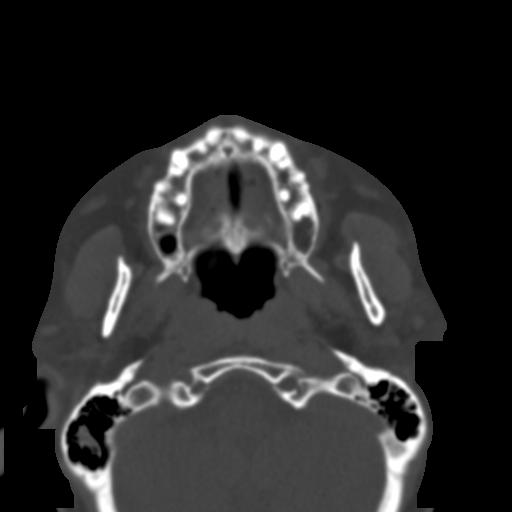
[im 84/84  bone]
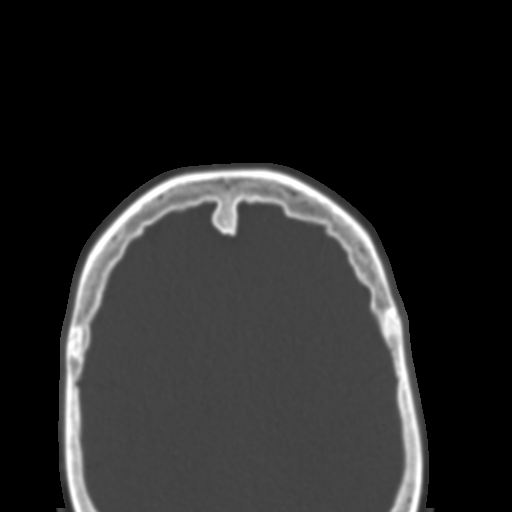

[Series 15: facialbone 2.0 sag st · sagittal · 0.33mm/px · 3 of 85 slices shown]
[im 22/85  bone]
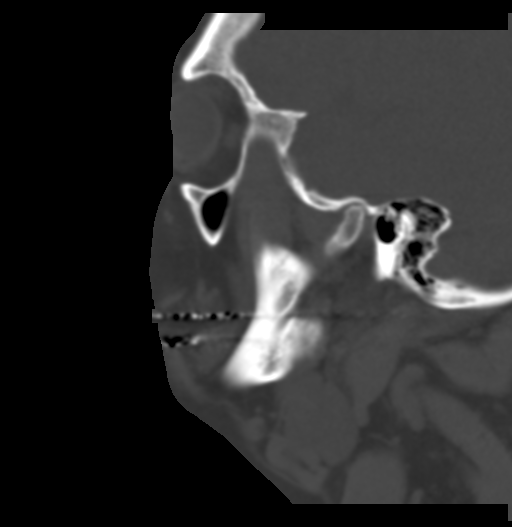
[im 43/85  bone]
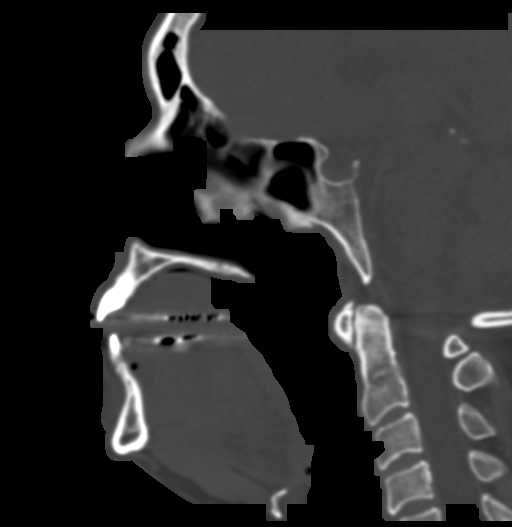
[im 64/85  bone]
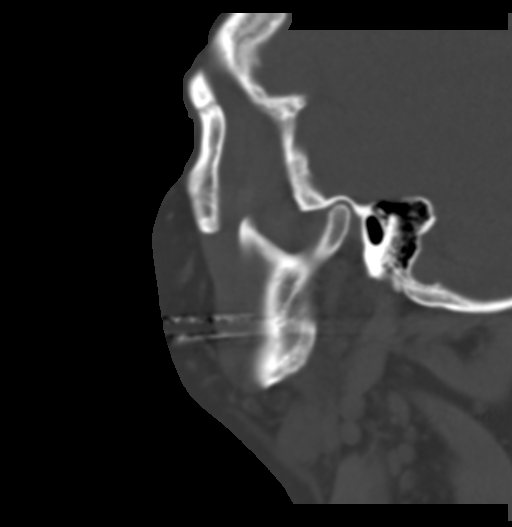

[6 of 33 positions shown; findings below may reference images not displayed]

FINDINGS: CT HEAD FINDINGS

Brain: No evidence of acute infarction, hemorrhage, hydrocephalus,
extra-axial collection or mass lesion/mass effect.

Mild cerebellar atrophy is noted.

The brainstem and fourth ventricle are within normal limits. The
third and lateral ventricles, and basal ganglia are unremarkable in
appearance. The cerebral hemispheres are symmetric in appearance,
with normal gray-white differentiation. No mass effect or midline
shift is seen.

Vascular: No hyperdense vessel or unexpected calcification.

Skull: There is no evidence of fracture; visualized osseous
structures are unremarkable in appearance.

Other: No significant soft tissue abnormalities are seen.

CT MAXILLOFACIAL FINDINGS

Osseous: There is no evidence of fracture or dislocation. The
maxilla and mandible appear intact. The nasal bone is unremarkable
in appearance. The visualized dentition demonstrates no acute
abnormality.

Orbits: The orbits are intact bilaterally.

Sinuses: The visualized paranasal sinuses and mastoid air cells are
well-aerated.

Soft tissues: No significant soft tissue abnormalities are seen. The
parapharyngeal fat planes are preserved. The nasopharynx, oropharynx
and hypopharynx are unremarkable in appearance. The visualized
portions of the valleculae and piriform sinuses are grossly
unremarkable.

The parotid and submandibular glands are within normal limits. No
cervical lymphadenopathy is seen.

CT CERVICAL SPINE FINDINGS

Alignment: Normal.

Skull base and vertebrae: No acute fracture. No primary bone lesion
or focal pathologic process.

Soft tissues and spinal canal: No prevertebral fluid or swelling. No
visible canal hematoma.

Disc levels: Intervertebral disc spaces are preserved. The bony
foramina are grossly unremarkable.

Upper chest: The thyroid gland is unremarkable in appearance. Mild
calcification is noted at the carotid bifurcations bilaterally. The
visualized lung apices are clear.

Other: No additional soft tissue abnormalities are seen.
IMPRESSION: 1. No evidence of traumatic intracranial injury or fracture.
2. No evidence of fracture or subluxation along the cervical spine.
3. No evidence of fracture or dislocation with regard to the
maxillofacial structures.
4. Mild calcification at the carotid bifurcations bilaterally.

## 2019-10-08 ENCOUNTER — Telehealth: Payer: Self-pay | Admitting: *Deleted

## 2019-10-08 NOTE — Telephone Encounter (Signed)
A message was left, re: her follow up visit. 

## 2019-12-14 ENCOUNTER — Encounter: Payer: Self-pay | Admitting: Cardiology

## 2023-11-16 ENCOUNTER — Emergency Department (HOSPITAL_COMMUNITY)
Admission: EM | Admit: 2023-11-16 | Discharge: 2023-11-17 | Disposition: A | Discharge status: Home / Self Care | Payer: MEDICAID | Attending: Emergency Medicine | Admitting: Emergency Medicine

## 2023-11-16 ENCOUNTER — Other Ambulatory Visit: Payer: Self-pay

## 2023-11-16 ENCOUNTER — Emergency Department (HOSPITAL_COMMUNITY): Payer: MEDICAID

## 2023-11-16 DIAGNOSIS — R112 Nausea with vomiting, unspecified: Secondary | ICD-10-CM | POA: Insufficient documentation

## 2023-11-16 DIAGNOSIS — R1084 Generalized abdominal pain: Secondary | ICD-10-CM | POA: Insufficient documentation

## 2023-11-16 DIAGNOSIS — R531 Weakness: Secondary | ICD-10-CM | POA: Insufficient documentation

## 2023-11-16 DIAGNOSIS — R197 Diarrhea, unspecified: Secondary | ICD-10-CM | POA: Insufficient documentation

## 2023-11-16 MED ORDER — FAMOTIDINE IN NACL 20-0.9 MG/50ML-% IV SOLN
20.0000 mg | Freq: Once | INTRAVENOUS | Status: AC
  Administered 2023-11-16: 20 mg via INTRAVENOUS
  Filled 2023-11-16: qty 50

## 2023-11-16 MED ORDER — MORPHINE SULFATE (PF) 4 MG/ML IV SOLN
4.0000 mg | Freq: Once | INTRAVENOUS | Status: AC
  Administered 2023-11-16: 4 mg via INTRAVENOUS
  Filled 2023-11-16: qty 1

## 2023-11-16 MED ORDER — ONDANSETRON HCL 4 MG/2ML IJ SOLN
4.0000 mg | Freq: Once | INTRAMUSCULAR | Status: AC
  Administered 2023-11-16: 4 mg via INTRAVENOUS
  Filled 2023-11-16: qty 2

## 2023-11-16 MED ORDER — SODIUM CHLORIDE 0.9 % IV BOLUS
1000.0000 mL | Freq: Once | INTRAVENOUS | Status: AC
  Administered 2023-11-16: 1000 mL via INTRAVENOUS

## 2023-11-16 MED ORDER — IOHEXOL 300 MG/ML  SOLN
100.0000 mL | Freq: Once | INTRAMUSCULAR | Status: AC | PRN
  Administered 2023-11-16: 100 mL via INTRAVENOUS

## 2023-11-17 MED ORDER — ONDANSETRON 4 MG PO TBDP: 4.0000 mg | ORAL_TABLET | Freq: Three times a day (TID) | ORAL | 0 refills | Status: AC | PRN

## 2023-11-17 MED ORDER — DICYCLOMINE HCL 20 MG PO TABS: 20.0000 mg | ORAL_TABLET | Freq: Two times a day (BID) | ORAL | 0 refills | Status: AC

## 2023-11-17 MED ORDER — LOPERAMIDE HCL 2 MG PO CAPS: 2.0000 mg | ORAL_CAPSULE | Freq: Four times a day (QID) | ORAL | 0 refills | Status: AC | PRN

## 2023-11-18 ENCOUNTER — Encounter: Payer: Self-pay | Admitting: Cardiology
